# Patient Record
Sex: Female | Born: 1940
Health system: Southern US, Community
[De-identification: ages and names within clinical notes are randomized; demographics above are authoritative.]

## PROBLEM LIST (undated history)

## (undated) DIAGNOSIS — M797 Fibromyalgia: Secondary | ICD-10-CM

## (undated) DIAGNOSIS — G2581 Restless legs syndrome: Secondary | ICD-10-CM

## (undated) DIAGNOSIS — G4730 Sleep apnea, unspecified: Secondary | ICD-10-CM

## (undated) DIAGNOSIS — M1990 Unspecified osteoarthritis, unspecified site: Secondary | ICD-10-CM

## (undated) DIAGNOSIS — E785 Hyperlipidemia, unspecified: Secondary | ICD-10-CM

## (undated) DIAGNOSIS — I10 Essential (primary) hypertension: Secondary | ICD-10-CM

## (undated) DIAGNOSIS — E039 Hypothyroidism, unspecified: Secondary | ICD-10-CM

## (undated) DIAGNOSIS — K52831 Collagenous colitis: Secondary | ICD-10-CM

## (undated) HISTORY — PX: FOOT SURGERY: SHX648

## (undated) HISTORY — PX: APPENDECTOMY: SHX54

## (undated) HISTORY — DX: Collagenous colitis: K52.831

## (undated) HISTORY — PX: HYSTEROSCOPY: SHX211

## (undated) HISTORY — DX: Unspecified osteoarthritis, unspecified site: M19.90

## (undated) HISTORY — PX: BREAST BIOPSY: SHX20

## (undated) HISTORY — DX: Essential (primary) hypertension: I10

## (undated) HISTORY — DX: Fibromyalgia: M79.7

## (undated) HISTORY — DX: Hypothyroidism, unspecified: E03.9

## (undated) HISTORY — PX: DILATION AND CURETTAGE OF UTERUS: SHX78

## (undated) HISTORY — DX: Restless legs syndrome: G25.81

## (undated) HISTORY — DX: Hyperlipidemia, unspecified: E78.5

## (undated) HISTORY — DX: Sleep apnea, unspecified: G47.30

## (undated) HISTORY — PX: CATARACT EXTRACTION: SUR2

## (undated) HISTORY — PX: TONSILLECTOMY: SUR1361

---

## 1998-01-05 ENCOUNTER — Ambulatory Visit (HOSPITAL_COMMUNITY): Admission: RE | Admit: 1998-01-05 | Discharge: 1998-01-05 | Payer: Self-pay | Admitting: Obstetrics & Gynecology

## 1998-02-05 ENCOUNTER — Other Ambulatory Visit: Admission: RE | Admit: 1998-02-05 | Discharge: 1998-02-05 | Payer: Self-pay | Admitting: Obstetrics & Gynecology

## 1998-12-10 ENCOUNTER — Emergency Department (HOSPITAL_COMMUNITY): Admission: EM | Admit: 1998-12-10 | Discharge: 1998-12-10 | Payer: Self-pay | Admitting: Emergency Medicine

## 1998-12-10 ENCOUNTER — Encounter: Payer: Self-pay | Admitting: Emergency Medicine

## 1999-01-05 ENCOUNTER — Ambulatory Visit (HOSPITAL_COMMUNITY): Admission: RE | Admit: 1999-01-05 | Discharge: 1999-01-05 | Payer: Self-pay | Admitting: *Deleted

## 1999-02-04 ENCOUNTER — Ambulatory Visit (HOSPITAL_COMMUNITY): Admission: RE | Admit: 1999-02-04 | Discharge: 1999-02-04 | Payer: Self-pay | Admitting: Obstetrics and Gynecology

## 1999-03-04 ENCOUNTER — Other Ambulatory Visit: Admission: RE | Admit: 1999-03-04 | Discharge: 1999-03-04 | Payer: Self-pay | Admitting: Obstetrics and Gynecology

## 2000-02-07 ENCOUNTER — Encounter: Payer: Self-pay | Admitting: Obstetrics and Gynecology

## 2000-02-07 ENCOUNTER — Ambulatory Visit (HOSPITAL_COMMUNITY): Admission: RE | Admit: 2000-02-07 | Discharge: 2000-02-07 | Payer: Self-pay | Admitting: Obstetrics and Gynecology

## 2000-03-14 ENCOUNTER — Other Ambulatory Visit: Admission: RE | Admit: 2000-03-14 | Discharge: 2000-03-14 | Payer: Self-pay | Admitting: Obstetrics and Gynecology

## 2000-09-21 ENCOUNTER — Ambulatory Visit (HOSPITAL_COMMUNITY): Admission: RE | Admit: 2000-09-21 | Discharge: 2000-09-21 | Payer: Self-pay | Admitting: Obstetrics and Gynecology

## 2000-09-21 ENCOUNTER — Encounter (INDEPENDENT_AMBULATORY_CARE_PROVIDER_SITE_OTHER): Payer: Self-pay

## 2000-10-26 ENCOUNTER — Encounter: Payer: Self-pay | Admitting: Obstetrics and Gynecology

## 2000-10-26 ENCOUNTER — Ambulatory Visit (HOSPITAL_COMMUNITY): Admission: RE | Admit: 2000-10-26 | Discharge: 2000-10-26 | Payer: Self-pay | Admitting: Obstetrics and Gynecology

## 2001-01-10 ENCOUNTER — Encounter: Payer: Self-pay | Admitting: Obstetrics and Gynecology

## 2001-01-10 ENCOUNTER — Encounter: Admission: RE | Admit: 2001-01-10 | Discharge: 2001-01-10 | Payer: Self-pay | Admitting: Obstetrics and Gynecology

## 2001-04-03 ENCOUNTER — Other Ambulatory Visit: Admission: RE | Admit: 2001-04-03 | Discharge: 2001-04-03 | Payer: Self-pay | Admitting: Obstetrics and Gynecology

## 2002-04-17 ENCOUNTER — Other Ambulatory Visit: Admission: RE | Admit: 2002-04-17 | Discharge: 2002-04-17 | Payer: Self-pay | Admitting: Obstetrics and Gynecology

## 2002-07-16 ENCOUNTER — Ambulatory Visit (HOSPITAL_COMMUNITY): Admission: RE | Admit: 2002-07-16 | Discharge: 2002-07-16 | Payer: Self-pay | Admitting: *Deleted

## 2002-08-18 ENCOUNTER — Encounter: Payer: Self-pay | Admitting: Obstetrics and Gynecology

## 2002-08-18 ENCOUNTER — Ambulatory Visit (HOSPITAL_COMMUNITY): Admission: RE | Admit: 2002-08-18 | Discharge: 2002-08-18 | Payer: Self-pay | Admitting: Obstetrics and Gynecology

## 2002-12-18 ENCOUNTER — Emergency Department (HOSPITAL_COMMUNITY): Admission: EM | Admit: 2002-12-18 | Discharge: 2002-12-18 | Payer: Self-pay | Admitting: Emergency Medicine

## 2003-03-13 ENCOUNTER — Encounter: Payer: Self-pay | Admitting: Obstetrics and Gynecology

## 2003-03-13 ENCOUNTER — Encounter: Admission: RE | Admit: 2003-03-13 | Discharge: 2003-03-13 | Payer: Self-pay | Admitting: Obstetrics and Gynecology

## 2004-07-26 ENCOUNTER — Other Ambulatory Visit: Admission: RE | Admit: 2004-07-26 | Discharge: 2004-07-26 | Payer: Self-pay | Admitting: Obstetrics and Gynecology

## 2005-03-23 ENCOUNTER — Encounter: Admission: RE | Admit: 2005-03-23 | Discharge: 2005-03-23 | Payer: Self-pay | Admitting: Obstetrics and Gynecology

## 2005-08-07 HISTORY — PX: HAMMER TOE SURGERY: SHX385

## 2005-08-21 ENCOUNTER — Observation Stay (HOSPITAL_COMMUNITY): Admission: EM | Admit: 2005-08-21 | Discharge: 2005-08-22 | Payer: Self-pay | Admitting: Emergency Medicine

## 2005-08-21 ENCOUNTER — Ambulatory Visit: Payer: Self-pay | Admitting: Cardiology

## 2005-08-23 ENCOUNTER — Ambulatory Visit: Payer: Self-pay

## 2005-08-23 ENCOUNTER — Encounter: Payer: Self-pay | Admitting: Cardiovascular Disease

## 2005-09-26 ENCOUNTER — Other Ambulatory Visit: Admission: RE | Admit: 2005-09-26 | Discharge: 2005-09-26 | Payer: Self-pay | Admitting: Obstetrics and Gynecology

## 2005-11-04 ENCOUNTER — Emergency Department (HOSPITAL_COMMUNITY): Admission: AD | Admit: 2005-11-04 | Discharge: 2005-11-04 | Payer: Self-pay | Admitting: Family Medicine

## 2005-12-06 ENCOUNTER — Encounter: Payer: Self-pay | Admitting: Obstetrics and Gynecology

## 2006-09-12 ENCOUNTER — Encounter: Admission: RE | Admit: 2006-09-12 | Discharge: 2006-09-12 | Payer: Self-pay | Admitting: Obstetrics and Gynecology

## 2006-12-26 ENCOUNTER — Ambulatory Visit (HOSPITAL_COMMUNITY): Admission: RE | Admit: 2006-12-26 | Discharge: 2006-12-26 | Payer: Self-pay | Admitting: *Deleted

## 2007-09-12 ENCOUNTER — Encounter: Admission: RE | Admit: 2007-09-12 | Discharge: 2007-09-12 | Payer: Self-pay | Admitting: Internal Medicine

## 2007-11-06 ENCOUNTER — Encounter: Admission: RE | Admit: 2007-11-06 | Discharge: 2007-11-06 | Payer: Self-pay | Admitting: *Deleted

## 2008-02-03 ENCOUNTER — Encounter: Admission: RE | Admit: 2008-02-03 | Discharge: 2008-04-21 | Payer: Self-pay | Admitting: Internal Medicine

## 2008-03-05 ENCOUNTER — Ambulatory Visit (HOSPITAL_COMMUNITY): Admission: RE | Admit: 2008-03-05 | Discharge: 2008-03-05 | Payer: Self-pay | Admitting: *Deleted

## 2008-03-05 ENCOUNTER — Encounter (INDEPENDENT_AMBULATORY_CARE_PROVIDER_SITE_OTHER): Payer: Self-pay | Admitting: *Deleted

## 2009-01-03 ENCOUNTER — Emergency Department (HOSPITAL_COMMUNITY): Admission: EM | Admit: 2009-01-03 | Discharge: 2009-01-03 | Payer: Self-pay | Admitting: Family Medicine

## 2009-09-23 ENCOUNTER — Encounter: Admission: RE | Admit: 2009-09-23 | Discharge: 2009-09-23 | Payer: Self-pay | Admitting: Unknown Physician Specialty

## 2010-08-07 HISTORY — PX: BACK SURGERY: SHX140

## 2010-09-14 ENCOUNTER — Ambulatory Visit (HOSPITAL_COMMUNITY)
Admission: RE | Admit: 2010-09-14 | Discharge: 2010-09-14 | Disposition: A | Discharge status: Home / Self Care | Payer: MEDICARE | Source: Ambulatory Visit | Attending: Neurological Surgery | Admitting: Neurological Surgery

## 2010-09-14 ENCOUNTER — Encounter (HOSPITAL_COMMUNITY)
Admission: RE | Admit: 2010-09-14 | Discharge: 2010-09-14 | Disposition: A | Discharge status: Home / Self Care | Payer: MEDICARE | Source: Ambulatory Visit | Attending: Neurological Surgery | Admitting: Neurological Surgery

## 2010-09-14 ENCOUNTER — Other Ambulatory Visit (HOSPITAL_COMMUNITY): Payer: Self-pay | Admitting: Neurological Surgery

## 2010-09-14 DIAGNOSIS — G4730 Sleep apnea, unspecified: Secondary | ICD-10-CM | POA: Insufficient documentation

## 2010-09-14 DIAGNOSIS — Z01818 Encounter for other preprocedural examination: Secondary | ICD-10-CM | POA: Insufficient documentation

## 2010-09-14 DIAGNOSIS — M48061 Spinal stenosis, lumbar region without neurogenic claudication: Secondary | ICD-10-CM | POA: Insufficient documentation

## 2010-09-14 DIAGNOSIS — I10 Essential (primary) hypertension: Secondary | ICD-10-CM | POA: Insufficient documentation

## 2010-09-14 LAB — CBC
HCT: 39.5 % (ref 36.0–46.0)
MCHC: 33.4 g/dL (ref 30.0–36.0)
MCV: 93.2 fL (ref 78.0–100.0)
Platelets: 296 10*3/uL (ref 150–400)
RDW: 12.2 % (ref 11.5–15.5)
WBC: 7.2 10*3/uL (ref 4.0–10.5)

## 2010-09-14 LAB — BASIC METABOLIC PANEL
BUN: 12 mg/dL (ref 6–23)
CO2: 27 mEq/L (ref 19–32)
Creatinine, Ser: 0.84 mg/dL (ref 0.4–1.2)
GFR calc Af Amer: >60 mL/min (ref >60)
Glucose, Bld: 103 mg/dL — ABNORMAL HIGH (ref 70–99)
Potassium: 4 mEq/L (ref 3.5–5.1)

## 2010-09-14 LAB — SURGICAL PCR SCREEN
MRSA, PCR: NEGATIVE
Staphylococcus aureus: NEGATIVE

## 2010-09-19 ENCOUNTER — Inpatient Hospital Stay (HOSPITAL_COMMUNITY)
Admission: RE | Admit: 2010-09-19 | Discharge: 2010-09-23 | DRG: 460 | Disposition: A | Discharge status: Home Health | Payer: MEDICARE | Source: Ambulatory Visit | Attending: Neurological Surgery | Admitting: Neurological Surgery

## 2010-09-19 ENCOUNTER — Inpatient Hospital Stay (HOSPITAL_COMMUNITY): Payer: MEDICARE

## 2010-09-19 DIAGNOSIS — M48061 Spinal stenosis, lumbar region without neurogenic claudication: Principal | ICD-10-CM | POA: Diagnosis present

## 2010-09-19 DIAGNOSIS — G4733 Obstructive sleep apnea (adult) (pediatric): Secondary | ICD-10-CM | POA: Diagnosis present

## 2010-09-19 DIAGNOSIS — R339 Retention of urine, unspecified: Secondary | ICD-10-CM | POA: Diagnosis present

## 2010-09-19 DIAGNOSIS — M4310 Spondylolisthesis, site unspecified: Secondary | ICD-10-CM | POA: Diagnosis present

## 2010-09-19 DIAGNOSIS — I10 Essential (primary) hypertension: Secondary | ICD-10-CM | POA: Diagnosis present

## 2010-09-19 DIAGNOSIS — Z7982 Long term (current) use of aspirin: Secondary | ICD-10-CM

## 2010-10-18 NOTE — Op Note (Signed)
Chelsea Taylor, Chelsea Taylor                ACCOUNT NO.:  [id number]  MEDICAL RECORD NO.:  [id number]           PATIENT TYPE:  I  LOCATION:  3114                         FACILITY:  [facility]  PHYSICIAN:  Stefani Dama, M.D.  DATE OF BIRTH:  Jan 03, 1941  DATE OF PROCEDURE: DATE OF DISCHARGE:                              OPERATIVE REPORT   PREOPERATIVE DIAGNOSIS:  Lumbar spondylolisthesis L4-L5 with severe stenosis.  POSTOPERATIVE DIAGNOSIS:  Lumbar spondylolisthesis L4-L5 with severe stenosis.  PROCEDURE:  Lumbar decompression L4-L5 with laminectomy L4, decompression of L4 and L5 nerve roots beyond that which is required for simple posterior lumbar interbody arthrodesis, posterior lumbar interbody arthrodesis with PEEK spacers, local autograft and allograft, posterolateral arthrodesis with local autograft and allograft, pedicle screw fixation L4 and L5.  SURGEON:  Stefani Dama, MD  FIRST ASSISTANT:  Hewitt Shorts, MD  ANESTHESIA:  General endotracheal.  INDICATIONS:  Chelsea Taylor is a 70 year old individual who has had significant back and bilateral lower extremity pain from severe spondylitic stenosis.  She had spondylolisthesis at the L4-L5 level and has a high- grade block at that level.  The patient was advised regarding the need for surgical decompression and stabilization and is now taken to the operating room for this procedure.  PROCEDURE:  The patient was brought to the operating room, supine on a stretcher.  After smooth induction of general endotracheal anesthesia, she was turned prone.  The back was prepped with alcohol and DuraPrep and draped in a sterile fashion.  Midline incision was created and carried down to the lumbodorsal fascia.  The spinous processes of L4 and L5 were identified positively with radiographs and then laminotomy was created on the left side first decompressing inferior aspect of the L4 vertebra out to and including a complete facetectomy.  The  yellow ligament was identified and it was noted to be severely thickened.  It was carefully lifted.  The lateral aspect of the medial wall of the facet was then taken out with a high-speed drill.  Entirety of the superior articular process was then identified and this was also decompressed to allow for egress of the L5 nerve root.  The disk space was then explored and it was noted to be significantly bulging posteriorly.  Epidural veins in this area were cauterized and divided so as to allow retraction of the common dural tube and the L5 nerve root. The L4 nerve root was identified superiorly to egress out under the pedicle of L4.  This area was decompressed also.  A similar laminotomy and decompression was then performed on the opposite side.  This was done with the help of Dr. Newell Coral who provided retraction and exposure while I worked drilling and removing bits of bone and tissue.  The disk space was then entered and a complete diskectomy was performed at L4 and L5 using a series of rongeurs and osteotomes to decorticate the endplates of L4 and L5.  The interspace was then distracted with the 10- mm spacer on one side while we worked on the opposite side to complete the diskectomy and decorticate the endplates.  In the end,  it was felt that 11-mm PEEK spacer would fit the best and this was filled with autograft and allograft with PureGen on Vitoss bone sponge.  The interspace was filled with this material.  The PEEK spacers were then placed into each side and countersunk appropriately.  Then with fluoroscopic imaging, we were able to place pedicle screws in L4 and L5 sounding each hole individually, tapping it, and then checking for evidence of cutout.  A 6.5- x 45-mm screws were placed in all four spaces at L4 and L5.  Two 40-mm precontoured rods were then used to connect the screw heads in a neutral construct.  Final radiographs were obtained.  The lateral gutters which had been  decorticated earlier were then packed with the remaining autograft and the Vitoss bone sponge soaked with PureGen.  Hemostasis in the soft tissues was obtained before final closure.  We checked for the patency of the L4 and the L5 nerve roots.  Common dural tube was also noted to be patent.  No spinal fluid leaks occurred and the lumbodorsal fascia was closed with #1 Vicryl in an interrupted fashion, 2-0 Vicryl was used in the subcutaneous tissues, 3-0 Vicryl subcuticularly, and Dermabond was placed on the skin.  Blood loss for the procedure was estimated 200 mL.     Stefani Dama, M.D.     Merla Riches  D:  09/19/2010  T:  09/20/2010  Job:  [id number]  Electronically Signed by Barnett Abu M.D. on 10/18/2010 09:23:13 AM

## 2010-10-18 NOTE — Discharge Summary (Signed)
NAMESHELTON, SOLER                ACCOUNT NO.:  [id number]  MEDICAL RECORD NO.:  [id number]           PATIENT TYPE:  I  LOCATION:  3019                         FACILITY:  [facility]  PHYSICIAN:  Stefani Dama, M.D.  DATE OF BIRTH:  November 03, 1940  DATE OF ADMISSION:  09/19/2010 DATE OF DISCHARGE:  09/23/2010                              DISCHARGE SUMMARY   ADMITTING DIAGNOSIS:  Lumbar spondylolisthesis, degenerative type, L4-5 with severe spinal stenosis, multifactorial.  DISCHARGE DIAGNOSIS:  Lumbar spondylolisthesis, degenerative type, L4-5 with severe spinal stenosis, multifactorial.  SECONDARY DIAGNOSES: 1. Hypertension. 2. History of colitis.  OPERATIONS AND PROCEDURES:  Posterior spinal decompression and fusion L4- 5 with interbody spacers and posterolateral arthrodesis, pedicle screw fixation.  BRIEF HISTORY AND HOSPITAL COURSE:  The patient is a 70 year old female who has had significant low back pain, bilateral lower extremity pain with severe spondylitic multifactorial spinal stenosis, and degenerative spondylolisthesis at L4-5 high-grade block at this level.  She failed conservative care to include physical therapy, home exercise program, epidural steroid injections, medications at time, and with continued symptomatology, elects to proceed with decompression and fusion and surgical intervention.  She tolerated the surgery well on September 19, 2010.  Postoperatively, she was placed in the Neurosurgical ICU, placed on PCA Dilaudid pain pump.  First day postoperatively, she was doing well, eating well.  Foley catheter was discontinued.  She did have some urinary retention that responded well to one dose of Flomax.  She was weaned off PCA pump.  Her IV was hep-locked, placed on Percocet and Robaxin or Flexeril for pain control and she was also given IV Toradol. She was transferred to 3000.  Discharge planning was arranged.  Started with physical therapy and occupational  therapy, instructed on back precautions, placed in an Aspen QuickDraw brace for support to her lumbar spine postoperatively when she is up and about.  She did have to undergo in-and-out cath x1 prior to her dose of Flomax.  She then was voiding well, eating well, ambulating safely, and ready for discharge home on September 23, 2010.  She remained neurovascularly intact throughout her hospital stay.  She was thought to have a shower and do other ADLs with occupational therapy prior to discharge home.  She was running low-grade fever, started on incentive spirometry which she is to continue when she goes home, and encouraged on fluid intake and mobilization.  DISCHARGE INSTRUCTIONS:  Discharged home on September 23, 2010.  Given prescription for Percocet 5/325 one p.o. q.4 h p.r.n. pain, #60 tablets, no refills.  Given prescription for Flexeril 10 mg 1 p.o. q.8-12 h p.r.n. muscle spasm, #60, no refills.  She is to continue on her home medications of: 1. Losartan 100 mg p.o. daily. 2. Fish oil 1200 mg over-the-counter p.o. daily. 3. Bisoprolol 5 mg p.o. daily. 4. Flexeril 10 mg one p.o. q.8-12 h p.r.n. muscle spasm. 5. Enteric-coated aspirin 81 mg p.o. daily. 6. Potassium gluconate p.o. at bedtime. 7. Sulfasalazine 500 mg 2 tablets b.i.d. 8. Synthroid 137 mcg p.o. daily. 9. CoQ10 of 100 mg p.o. daily. 10.Magnesium, red yeast rice, bioactive calcium supplement, vitamin C  and vitamin D over-the-counter supplements. She may restart as she takes at home.  Follow up with Dr. Danielle Dess in 3-4 weeks in our office, call for an appointment.  Continue back precautions.  All questions were encouraged and answered and addressed.  DISCHARGE CONDITION:  Stable and improved.     Aura Fey Bobbe Medico.   ______________________________ Stefani Dama, M.D.    SCI/MEDQ  D:  09/23/2010  T:  09/24/2010  Job:  [id number]  Electronically Signed by Orlin Hilding P.A. on 09/28/2010 02:51:14  PM Electronically Signed by Barnett Abu M.D. on 10/18/2010 09:23:03 AM

## 2010-11-15 LAB — DIFFERENTIAL
Basophils Relative: 1 % (ref 0–1)
Eosinophils Absolute: 0.1 10*3/uL (ref 0.0–0.7)
Eosinophils Relative: 1 % (ref 0–5)
Lymphs Abs: 1.3 10*3/uL (ref 0.7–4.0)
Monocytes Relative: 7 % (ref 3–12)
Neutro Abs: 7.9 10*3/uL — ABNORMAL HIGH (ref 1.7–7.7)
Neutrophils Relative %: 78 % — ABNORMAL HIGH (ref 43–77)

## 2010-11-15 LAB — POCT I-STAT, CHEM 8
Calcium, Ion: 1.14 mmol/L (ref 1.12–1.32)
Hemoglobin: 13.3 g/dL (ref 12.0–15.0)
TCO2: 23 mmol/L (ref 0–100)

## 2010-11-15 LAB — CBC
Hemoglobin: 12.4 g/dL (ref 12.0–15.0)
RBC: 3.97 MIL/uL (ref 3.87–5.11)
WBC: 10 10*3/uL (ref 4.0–10.5)

## 2010-12-20 NOTE — Assessment & Plan Note (Signed)
[facility]                                 ON-CALL NOTE   CHRISHELLE, ZITO                         MRN:          [id number]  DATE:01/03/2009                            DOB:          04/17/1941    Patient: Chelsea Taylor   Phone number [phone].   PHYSICIAN:  Dr. Sabino Gasser.   [name] relates a history of colitis and states that she is  maintained on Azulfidine.  She was placed on indomethacin several weeks  ago and developed diarrhea and indomethacin was discontinued.  Since  then she has had persistent diarrhea.  She apparently spoke to Dr. Virginia Rochester  who advised her to remain on Azulfidine presuming this was a self-  limited diarrhea, however, her symptoms have persisted.  She notes no  fevers, chills, nausea, vomiting, abdominal pain, or blood in her bowel  movements.  Her appetite is good and she is eating well; however, she is  concerned that the diarrhea has persisted.  She has Entocort at home and  has taken it in the past for colitis flare and wondered if she should  start this medication.  I advised her to be seen in an urgent care  center today for further evaluation with blood work and stool cultures.  If no cause is uncovered, I think it would be reasonable to start  Entocort 9 mg daily in addition to Azulfidine.  She is advised to  contact Dr. Wende Neighbors office Tuesday morning for furhter advice and an  appointment.     Venita Lick. Russella Dar, MD, [facility]  Electronically Signed    MTS/MedQ  DD: 01/03/2009  DT: 01/03/2009  Job #: [id number]   cc:   Georgiana Spinner, M.D.

## 2010-12-20 NOTE — Op Note (Signed)
Chelsea Taylor, Chelsea Taylor                ACCOUNT NO.:  [id number]   MEDICAL RECORD NO.:  [id number]          PATIENT TYPE:  AMB   LOCATION:  ENDO                         FACILITY:  [facility]   PHYSICIAN:  Georgiana Spinner, M.D.    DATE OF BIRTH:  06-12-1941   DATE OF PROCEDURE:  03/05/2008  DATE OF DISCHARGE:                               OPERATIVE REPORT   PROCEDURE:  Colonoscopy.   INDICATIONS:  Diarrhea.   ANESTHESIA:  1. Fentanyl 100 mcg.  2. Versed 7 mg.   PROCEDURE:  With the patient mildly sedated in the left lateral  decubitus position, the Pentax videoscopic colonoscope was inserted in  the rectum and passed under direct vision.  With pressure applied and  the patient rolled to her back, we finally were able to reach the cecum,  identified by the base of cecum and ileocecal valve, both of which were  photographed.  From this point, the colonoscope was slowly withdrawn,  taking circumferential views of the colonic mucosa, stopping along the  way to take random biopsies of normal-appearing mucosa, suctioning milky-  like material from throughout the colon until we reached the rectum,  which appeared normal on direct and showed hemorrhoids on retroflexed  view.  The endoscope was straightened and withdrawn.  The patient's  vital signs and pulse oximeter remained stable.  The patient tolerated  procedure well, without apparent complication.   FINDINGS:  Tortuous colon, and internal hemorrhoids noted, but otherwise  unremarkable, with a very long colon with the patient's inability to  hold making this somewhat difficult to do.           ______________________________  Georgiana Spinner, M.D.     GMO/MEDQ  D:  03/05/2008  T:  03/05/2008  Job:  [id number]

## 2010-12-20 NOTE — Op Note (Signed)
NAMEMARIALY, URBANCZYK                ACCOUNT NO.:  [id number]   MEDICAL RECORD NO.:  [id number]          PATIENT TYPE:  AMB   LOCATION:  ENDO                         FACILITY:  [facility]   PHYSICIAN:  Georgiana Spinner, M.D.    DATE OF BIRTH:  1941/03/17   DATE OF PROCEDURE:  12/26/2006  DATE OF DISCHARGE:                               OPERATIVE REPORT   PROCEDURE:  Colonoscopy.   INDICATIONS:  Colon polyps.   ANESTHESIA:  1. Fentanyl 75 mcg,.  2. Versed 7 mg.   PROCEDURE:  With the patient mildly sedated in the left lateral  decubitus position, the Pentax videoscopic colonoscope was inserted into  the rectum and. after pulling back and withdrawn and reinserting, and  with pressure applied and the patient rolled to her back, we were  subsequently able to reach the cecum, as identified by ileocecal valve  and appendiceal orifice, both of which were photographed.  From this  point, the colonoscope was slowly withdrawn, taking circumferential  views of the colonic mucosa.  The prep was somewhat suboptimal in that  there were multiple areas of copious amounts of liquid brownish material  that had particulate matter and that had to be suctioned.  The patient  took a gallon of Gatorade to dilute her prep rather than 2 liters, but  we withdrew all the way to the rectum, which appeared normal on direct  and showed hemorrhoids on retroflexed view.  The endoscope was  straightened and withdrawn.  The patient's vital signs and pulse  oximeter remained stable.  The patient tolerated the procedure well,  without apparent complications.   FINDINGS:  Internal hemorrhoids.  Otherwise, an unremarkable exam.   PLAN:  Have the patient follow up with me in 5 years or as needed.           ______________________________  Georgiana Spinner, M.D.     GMO/MEDQ  D:  12/26/2006  T:  12/26/2006  Job:  [id number]

## 2010-12-23 NOTE — Op Note (Signed)
[facility]  Patient:    Chelsea Taylor, Chelsea Taylor                         MRN: [id number] Proc. Date: 09/21/00 Attending:  Erie Noe P. Pennie Rushing, M.D.                           Operative Report  PREOPERATIVE DIAGNOSIS:       Postmenopausal bleeding.  POSTOPERATIVE DIAGNOSIS:      Endometrial polyp and endometrial adhesions.  OPERATION:                    Operative hysteroscopy with hysteroscopic polypectomy, removal of adhesion and curettage.  SURGEON:                      Vanessa P. Pennie Rushing, M.D.  ANESTHESIA:                   General LMA.  ESTIMATED BLOOD LOSS:         Less than 50 cc.  COMPLICATIONS:                None.  FINDINGS:                     The uterus sounded to 7 cm.  At hysteroscopic examination, the endometrium was quite atrophic.  There was a 2 mm polyp at the fundus near the right ostium.  There was likewise an adhesion between the anterior and posterior endometrium that was just caudad to that endometrial polyp.  No clear uterine fibroid could be detected within the endometrial cavity.  DESCRIPTION OF PROCEDURE:     The patient was taken to the operating room after appropriate identification and placed on the operating table.  After the attainment of adequate general anesthesia, she was placed in the lithotomy position.  The perineum and vagina were prepped with multiple layers of Betadine.  The bladder was emptied with a red Robinson catheter.  The perineum was draped as a sterile field.  A Graves speculum was placed in the vagina and a single tooth tenaculum placed on the cervix.  The cervix was then dilated to accommodate the diagnostic hysteroscope and the hysteroscope was inserted with the above noted findings made and documented.  The operating hysteroscope was then replaced and the aforementioned endometrial polyp excised.  This was removed from the operative field and curettage undertaken.  That tissue was sent as a separate  specimen as endometrial curettings.  On replacement of the hysteroscope, it was noted that the polyps had been removed and the intrauterine adhesion had likewise been removed.  Hemostasis was noted to be adequate.  All instruments were then removed from the uterus and the fluid deficit noted to be 190 cc.  All instruments were removed from the vagina and the patient awakened from general anesthesia and taken to the recovery room in satisfactory condition, having tolerated the procedure well with sponge and instrument counts correct. DD:  09/21/00 TD:  09/21/00 Job: [id number]

## 2010-12-23 NOTE — Discharge Summary (Signed)
NAMEDAKISHA, SCHOOF                ACCOUNT NO.:  [id number]   MEDICAL RECORD NO.:  [id number]          PATIENT TYPE:  INP   LOCATION:  6524                         FACILITY:  [facility]   PHYSICIAN:  Charlton Haws, M.D.     DATE OF BIRTH:  1941/02/27   DATE OF ADMISSION:  08/21/2005  DATE OF DISCHARGE:  08/22/2005                                 DISCHARGE SUMMARY   PRIMARY CARDIOLOGIST:  Dayton Bing, M.D. [facility] (new).   PRINCIPAL DIAGNOSES:  1.  Angina pectoris (new onset).      1.  Normal serial cardiac markers.      2.  scheduled for same-day outpatient pharmacologic stress test.      3.  Negative routine treadmill test in 2004.  2.  Hypertension.   HISTORY OF PRESENT ILLNESS:  Ms. Elrod is a 70 year old female with no  prior cardiac history who has cardiac risk factors notable for hypertension,  family history, and age who presented to the emergency room with new onset  bilateral jaw and chest pain, worrisome for unstable angina pectoris.   HOSPITAL COURSE:  The patient was seen and evaluated through the emergency  room, with recommendations to proceed with cardiac catheterization.  However, she declined but agreed to proceed with a stress test if she ruled  out for myocardial infarction.   Serial cardiac markers were all within normal limits.  Additionally, a D-  dimer was also negative.   Admission chest x-ray notable for prominent left hilum/pulmonary artery.  Dr. Dietrich Pates recommended that this be further evaluated as an outpatient.   The patient also reported dysphagia on admission and will also need further  workup for this as well.   The patient was cleared for discharge the following morning, with  arrangements to proceed with same-day pharmacologic stress testing in our  office.  Of note, the patient is unable to exercise, given recent surgery on  her left foot.   The patient was cleared for discharge on all previous home medications.   DISCHARGE MEDICATIONS:  1.   Enteric-coated aspirin 81 mg daily.  2.  Toprol-XL 25 mg daily.  3.  Synthroid 0.112 daily.  4.  Micardis 40 daily.  5.  Mirapex 0.5 q.h.s.  6.  Fosamax weekly.   DISCHARGE INSTRUCTIONS:  Proceed with adenosine stress test today, August 22, 2005 at 11:45 a.m. at [facility].   FOLLOW UP:  1.  Followup with Dr.  Bing as needed.  2.  Schedule followup with Dr. Dimas Alexandria.   DISCHARGE DURATION TIME:  Less than 30 minutes.      Gene Serpe, P.A. [facility]    ______________________________  Charlton Haws, M.D.    GS/MEDQ  D:  08/22/2005  T:  08/22/2005  Job:  [id number]   cc:   Janae Bridgeman. Eloise Harman., M.D.  Fax: [phone]

## 2010-12-23 NOTE — H&P (Signed)
[facility]  Patient:    Chelsea Taylor, Chelsea Taylor                     MRN: [id number] Attending:  Maris Berger. Pennie Rushing, M.D. Dictator:   Marquis Lunch. Lowell Guitar, P.A.-C.                         History and Physical  DATE OF BIRTH:                1941/07/20  HISTORY OF PRESENT ILLNESS:   Ms. Bradshaw is a 70 year old married menopausal white female, para 1-0-0-1, with a five day history of vaginal spotting and cramping.  The patient is on Prempro 0.625/2.5 for hormone replacement therapy and has not had any bleeding until this current episode.  In 1990, the patient underwent a hysteroscopy D&C for irregular bleeding and was found to have focal hyperplasia.  Follow-up endometrial biopsies have been precluded by the patients stenotic cervix.  She presents today for hysteroscopy D&C to evaluate postmenopausal bleeding.  PAST MEDICAL HISTORY AND OBSTETRICAL HISTORY:     Gravida 1, para 1-0-0-1; the patient had a female infant in 29.  GYNECOLOGIC HISTORY:          Menarche 70 years old.  She has a history of uterine fibroids.  The patient had a normal mammogram July 2001 and a normal Pap smear August 2001.  PAST SURGICAL HISTORY:        S/P, hysteroscopy and D&C (focal hyperplasia); and left breast needle biopsy (fibroadenoma), tonsillectomy, and appendectomy. The patient does not want any blood products.  MEDICAL HISTORY:              Migraines, hypothyroidism, and urge incontinence.  FAMILY HISTORY:               Positive for juvenile onset diabetes (daughter), colon cancer, cardiovascular disease, and hypertension.  SOCIAL HISTORY:               The patient is married, and she is retired.  CURRENT MEDICATIONS:          1. Synthroid 150 mcg daily.                               2. Prempro 0.625/2.5.                               3. Flexeril 10 mg.  DRUG SENSITIVITIES:           PENICILLIN which causes a rash.  CODEINE which causes dizziness.  REVIEW OF  SYSTEMS:            Negative except as mentioned in previous history of present illness.  PHYSICAL EXAMINATION:  GENERAL:                      The patient is a well-developed, well-nourished white female in no acute distress.  VITAL SIGNS:                  Blood pressure 124/80, weight 168, height 5 feet 7-1/2 inches tall.  ENT:                          Within normal limits.  Thyroid is not enlarged.  HEART:  Regular rate and rhythm.  No murmur.  LUNGS:                        Without wheezes, rales, or rhonchi.  BACK:                         Without CVA tenderness.  ABDOMEN:                      Bowel sounds are present.  It is soft, nontender.  There is no organomegaly.  EXTREMITIES:                  No clubbing, cyanosis, or edema.  NEUROLOGIC:                   Within normal limits.  PELVIC:                       EG/BUS is within normal limits.  Vagina is normal though mildly atrophic.  Cervix is nontender with a stenotic os.  There are no lesions.  Uterus is approximately 10 weeks size and nontender.  Adnexa without tenderness or masses.  Rectovaginal without tenderness or masses.  IMPRESSION:                   Postmenopausal bleeding.  DISPOSITION:                  Implications for patients procedure along with risks and benefits were discussed.  The patient has consented to undergo a hysteroscopy D&C at [facility], September 21, 2000, at 2:30 pm DD:  09/20/00 TD:  09/20/00 Job: [id number]

## 2010-12-23 NOTE — H&P (Signed)
NAMEJAYLN, BRANSCOM                ACCOUNT NO.:  [id number]   MEDICAL RECORD NO.:  [id number]          PATIENT TYPE:  INP   LOCATION:  1832                         FACILITY:  [facility]   PHYSICIAN:  [place] Bing, M.D. LHCDATE OF BIRTH:  October 26, 1940   DATE OF ADMISSION:  08/21/2005  DATE OF DISCHARGE:                                HISTORY & PHYSICAL   PRIMARY CARDIOLOGIST:  Dr. [place] Bing (new)   REASON FOR ADMISSION:  Ms. Stegner is a 70 year old female with no prior  cardiac history who now presents to the emergency room with new onset  bilateral jaw, chest, and right biceps pain.   Patient has cardiac risk factors notable for hypertension, age, and family  history of coronary artery disease.  She denies any recent development of  any exertional chest pain or dyspnea.   While laying in bed reading a book at about 12:30 this afternoon patient  developed sudden, severe (8/10) bilateral jaw pain which subsequently  radiated down into the chest and into the right shoulder/biceps.  There was  no associated diaphoresis/dyspnea, or nausea/vomiting.   Patient contacted EMS who treated her with four baby aspirin, but no  nitroglycerin and she presented to the emergency room with complete  resolution of her chest discomfort.  A 12-lead EKG on arrival shows normal  sinus rhythm with no acute changes.  Initial cardiac enzymes are negative.   Patient reports having had a negative routine treadmill in 2004, but denied  any associated chest pain for that study.   ALLERGIES:  PENICILLIN, CODEINE.   HOME MEDICATIONS:  1.  Synthroid 0.112 daily.  2.  Micardis 40 daily.  3.  Toprol XL 25 daily.  4.  Fosamax every week.  5.  Mirapex 0.5 q.h.s.  6.  Aspirin 81 daily.  7.  Red yeast rice.   PAST MEDICAL HISTORY:  1.  Hypertension.  2.  Hypothyroidism.  3.  Gastroesophageal reflux disease/negative esophagogastroduodenoscopy      2003.  4.  Recurrent urinary tract infections.  5.   Status post appendectomy.  6.  Patient also had recent left foot surgery in November 2006 for treatment      of hammer toe (x5).   SOCIAL HISTORY:  Patient lives here in [place] with her husband.  She is  a retired Air[employer] pilot.  They have one grown daughter.  She has never smoked  tobacco and denies alcohol use.   FAMILY HISTORY:  Mother deceased age 42, history of CHF.  Father deceased  age 9 secondary to cancer, history of hypertension.  Brother age 55 status  post MI approximately 10 years ago.   REVIEW OF SYSTEMS:  As noted per HPI.  Denies any prior history of  myocardial infarction, congestive heart failure, or a stroke.  Denies any  current reflux symptoms, but does have difficulty with dysphagia.  Denies  any recent overt bleeding.  She experienced menopause in her 63s.  Remaining  systems negative.   PHYSICAL EXAMINATION:  VITAL SIGNS:  Blood pressure 151/75, temperature  98.4, pulse 83, respirations 18, saturations 98% on room air.  GENERAL:  70 year old female in no apparent distress.  HEENT:  Normocephalic, atraumatic.  NECK:  Preserved bilateral carotid pulses without bruits.  LUNGS:  Clear to auscultation in all fields.  HEART:  Regular rate and rhythm (S1, S2).  Positive S4, but no significant  murmurs.  ABDOMEN:  Soft, nontender with intact bowel sounds.  EXTREMITIES:  Preserved bilateral femoral pulses without bruits; intact  distal pulses with trace left lower extremity edema.  NEUROLOGIC:  No focal deficit.   Admission chest x-ray:  Prominent left hilum/pulmonary artery.  Electrocardiogram:  Normal sinus rhythm at 82 BPM with normal axis and no  acute changes.   LABORATORY DATA:  Hemoglobin 13, hematocrit 38, WBC 7.6, platelets 369.  Sodium 138, potassium 4.4, BUN 14, creatinine 0.8, glucose 99.  Cardiac  enzymes (POC):  MB 1.5, troponin I less than 0.05.   IMPRESSION:  1.  Unstable angina pectoris.  2.  Multiple cardiac risk factors.      1.   Hypertension.      2.  Family history of premature coronary artery disease.      3.  Age.  3.  Hypothyroidism.  4.  Abnormal chest x-ray.      1.  Prominent left hilum/pulmonary artery.  5.  Mild dysphagia.  6.  Status post recent left foot surgery.   PLAN:  Patient presents with new onset jaw pain which is worrisome for  unstable angina pectoris.  Despite the recommendation to proceed with  diagnostic coronary angiography patient is not inclined at this time to  proceed.  We will therefore keep patient for a 24-hour observation for rule  out of myocardial infarction.  If serial markers are negative then patient  will be cleared for discharge in the morning with plans to proceed with an  outpatient pharmacologic stress test tomorrow.  Regarding medications,  patient will continue on aspirin and we will switch  Toprol to Lopressor to 25 t.i.d.  We will defer anticoagulation for now and  will check a D-dimer level, TSH, and fasting lipid profile in the morning.  Of note, patient will need subsequent work-up for her dysphagia by her  primary care physician.  Additionally, she will also need to follow up on  the borderline abnormal chest x-ray result.      Gene Serpe, P.A. [facility]      [place] Bing, M.D. [facility]  Electronically Signed    GS/MEDQ  D:  08/21/2005  T:  08/21/2005  Job:  [id number]   cc:   Janae Bridgeman. Eloise Harman., M.D.  Fax: [phone]

## 2010-12-23 NOTE — Op Note (Signed)
   NAME:  Chelsea Taylor, Chelsea Taylor                          ACCOUNT NO.:  [id number]   MEDICAL RECORD NO.:  [id number]                   PATIENT TYPE:  AMB   LOCATION:  ENDO                                 FACILITY:  [facility]   PHYSICIAN:  Georgiana Spinner, M.D.                 DATE OF BIRTH:  1940-10-09   DATE OF PROCEDURE:  07/16/2002  DATE OF DISCHARGE:                                 OPERATIVE REPORT   PROCEDURE:  Upper endoscopy.   INDICATIONS:  Gastroesophageal reflux disease, abdominal pain.   ANESTHESIA:  Demerol 60, Versed 6 mg.   PROCEDURE:  With the patient mildly sedated in the left lateral decubitus  position, the Olympus video endoscope was inserted into the mouth and passed  under direct visualization through the esophagus which appeared normal into  the stomach. The fundus body and antrum, duodenal bulb and the second  portion of the duodenum appeared normal.   From this point the endoscope was slowly withdrawn, taking circumferential  views of the duodenal mucosa. The endoscope was then pulled back into the  stomach and placed in retroflexion to view the stomach from below. The  endoscope was then straightened and withdrawn, taking several more views of  the remaining gastric and esophageal mucosa. The patient's vital signs and  pulse oximetry remained stable. The patient tolerated the procedure well  without apparent complications.   FINDINGS:  Negative examination.   PLAN:  Proceed to colonoscopy.                                               Georgiana Spinner, M.D.    GMO/MEDQ  D:  07/16/2002  T:  07/16/2002  Job:  [id number]

## 2010-12-23 NOTE — Op Note (Signed)
   NAME:  Chelsea Taylor, Chelsea Taylor                          ACCOUNT NO.:  [id number]   MEDICAL RECORD NO.:  [id number]                   PATIENT TYPE:  AMB   LOCATION:  ENDO                                 [facility]ACILITY[facility]  [facility]   PHYSICIAN:  Georgiana Spinner, M.D.                 DATE OF BIRTH:  04-Sep-1940   DATE OF PROCEDURE:  07/16/2002  DATE OF DISCHARGE:                                 OPERATIVE REPORT   PROCEDURE:  Colonoscopy.   INDICATIONS:  Colon polyps previously noted.   ANESTHESIA:  Demerol 40 mg, Versed 4 mg additionally.   DESCRIPTION OF PROCEDURE:  With the patient mildly sedated in the left  lateral decubitus position, the Olympus videoscopic colonoscope was inserted  in the rectum and passed under direct vision to the cecum, identified by  ileocecal valve and appendiceal orifice, both of which were photographed.  From this point the colonoscope was slowly withdrawn, taking circumferential  views of the entire colonic mucosa, stopping only then in the rectum, which  appeared normal on direct and showed hemorrhoids on retroflexed view.  The  endoscope was straightened and withdrawn.  The patient's vital signs and  pulse oximetry remained stable.  The patient tolerated the procedure well  without apparent complications.   FINDINGS:  Internal hemorrhoids, otherwise unremarkable examination.   PLAN:  Repeat examination in five years.                                               Georgiana Spinner, M.D.    GMO/MEDQ  D:  07/16/2002  T:  07/16/2002  Job:  [id number]

## 2011-12-07 ENCOUNTER — Ambulatory Visit: Payer: Self-pay | Admitting: Obstetrics and Gynecology

## 2012-04-22 ENCOUNTER — Encounter: Payer: Self-pay | Admitting: Cardiology

## 2012-08-07 HISTORY — PX: HAMMER TOE SURGERY: SHX385

## 2013-02-28 ENCOUNTER — Encounter: Payer: Self-pay | Admitting: Neurology

## 2013-02-28 ENCOUNTER — Ambulatory Visit (INDEPENDENT_AMBULATORY_CARE_PROVIDER_SITE_OTHER): Payer: Medicare Other | Admitting: Neurology

## 2013-02-28 ENCOUNTER — Other Ambulatory Visit: Payer: Self-pay | Admitting: Neurology

## 2013-02-28 VITALS — BP 120/68 | HR 77 | Ht 66.25 in | Wt 172.0 lb

## 2013-02-28 DIAGNOSIS — G2581 Restless legs syndrome: Secondary | ICD-10-CM | POA: Insufficient documentation

## 2013-02-28 HISTORY — DX: Restless legs syndrome: G25.81

## 2013-02-28 NOTE — Progress Notes (Signed)
Reason for visit: Leg jerking  Chelsea Taylor is a 72 y.o. female  History of present illness:  Chelsea Taylor is a 72 year old right-handed white female with a history of lower extremity jerking of the last 2-3 years. The patient indicates that the episodes occur when she is inactive, usually beginning in mid to late afternoon, and also bothering her at nighttime when she tries to sleep. The jerking may occur in either leg independently or together. The patient has a sensation prior to this happening. The patient denies any weakness or numbness of the extremities, and she denies any jerking of the arms. The patient does not relate the leg jerking to any particular medication that was added or taken away at the time of onset. The patient has taken ropinirole and clonazepam, both of these medications have helped some. The patient believes that the effectiveness of the medications has waned over time. The patient denies any problems with balance or problems controlling the bowels or the bladder. The patient denies any neck discomfort, but she does occasionally have some left lower back or buttock discomfort. The patient denies any memory or confusion. The patient has not had any blackout episodes. The patient is sent to this office for an evaluation.  Past Medical History  Diagnosis Date  . Hypertension   . Fibromyalgia   . Restless legs syndrome (RLS) 02/28/2013  . Dyslipidemia   . Hypothyroid   . Collagenous colitis   . Degenerative arthritis     Knees  . Sleep apnea     Past Surgical History  Procedure Laterality Date  . Hammer toe surgery  2014  . Back surgery  2012  . Hammer toe surgery  2007  . Cataract extraction    . Tonsillectomy    . Appendectomy    . Dilation and curettage of uterus    . Hysteroscopy    . Breast biopsy Left     Fibroadenoma    Family History  Problem Relation Age of Onset  . Heart failure Mother   . Colon cancer Father   . Heart attack Brother   .  Diabetes Daughter     Social history:  reports that she has never smoked. She does not have any smokeless tobacco history on file. She reports that  drinks alcohol. She reports that she does not use illicit drugs.  Medications:  No current outpatient prescriptions on file prior to visit.   No current facility-administered medications on file prior to visit.    Allergies:  Allergies  Allergen Reactions  . Adhesive (Tape)   . Codeine   . Neosporin (Neomycin-Bacitracin Zn-Polymyx)   . Penicillins     ROS:  Out of a complete 14 system review of symptoms, the patient complains only of the following symptoms, and all other reviewed systems are negative.  Fatigue Snoring Joint pain, achy muscles Insomnia, decreased energy Restless legs  Blood pressure 120/68, pulse 77, height 5' 6.25" (1.683 m), weight 172 lb (78.019 kg).  Physical Exam  General: The patient is alert and cooperative at the time of the examination.  Head: Pupils are equal, round, and reactive to light. Discs are flat bilaterally.  Neck: The neck is supple, no carotid bruits are noted.  Respiratory: The respiratory examination is clear.  Cardiovascular: The cardiovascular examination reveals a regular rate and rhythm, no obvious murmurs or rubs are noted.  Skin: Extremities are without significant edema.  Neurologic Exam  Mental status:  Cranial nerves: Facial symmetry is  present. There is good sensation of the face to pinprick and soft touch bilaterally. The strength of the facial muscles and the muscles to head turning and shoulder shrug are normal bilaterally. Speech is well enunciated, no aphasia or dysarthria is noted. Extraocular movements are full. Visual fields are full.  Motor: The motor testing reveals 5 over 5 strength of all 4 extremities. Good symmetric motor tone is noted throughout.  Sensory: Sensory testing is intact to pinprick, soft touch, vibration sensation, and position sense on all 4  extremities. No evidence of extinction is noted.  Coordination: Cerebellar testing reveals good finger-nose-finger and heel-to-shin bilaterally.  Gait and station: Gait is normal. Tandem gait is slightly unsteady. Romberg is negative. No drift is seen.  Reflexes: Deep tendon reflexes are symmetric in arms, with depressed ankle jerk reflexes bilaterally. There is depression of the left knee jerk relative to the right. Toes are downgoing bilaterally.   Assessment/Plan:  One. Restless leg syndrome  The patient reports a fairly typical history of restless leg syndrome. Her symptoms begin in mid to late afternoon, and persist throughout the rest of the day. The patient has gained some improvement with the ropinirole and clonazepam, and these medications should be continued on a scheduled basis. The patient indicates that her symptoms are daily. The patient is to take 2 mg a ropinirole in mid afternoon, and 2 mg at night. The patient will continue to take 1 mg clonazepam at nighttime. The patient will have blood work done today for a vitamin B12 level and a ferritin level. The patient will followup through this office if needed. The patient indicates that her daughter and her father also had similar symptoms.  Chelsea Palau MD 03/01/2013 10:34 AM  [facility] [address] [place], [place] [address]  Phone [phone] Fax [phone]

## 2013-03-01 LAB — VITAMIN B12: Vitamin B-12: 483 pg/mL (ref 211–946)

## 2013-03-01 LAB — FERRITIN: Ferritin: 88 ng/mL (ref 15–150)

## 2013-03-03 NOTE — Progress Notes (Signed)
Quick Note:  Spoke with patient and relayed results of blood work. Patient understood and had no questions. A copy of the results is being mailed to the pt.  ______

## 2013-03-12 ENCOUNTER — Other Ambulatory Visit: Payer: Self-pay

## 2013-05-28 ENCOUNTER — Other Ambulatory Visit: Payer: Self-pay | Admitting: Gastroenterology

## 2013-06-12 ENCOUNTER — Other Ambulatory Visit: Payer: Self-pay

## 2013-06-26 ENCOUNTER — Ambulatory Visit (INDEPENDENT_AMBULATORY_CARE_PROVIDER_SITE_OTHER): Payer: Medicare Other | Admitting: Podiatry

## 2013-06-26 ENCOUNTER — Encounter: Payer: Self-pay | Admitting: Podiatry

## 2013-06-26 VITALS — BP 121/74 | HR 76 | Resp 16

## 2013-06-26 DIAGNOSIS — M216X9 Other acquired deformities of unspecified foot: Secondary | ICD-10-CM

## 2013-06-26 DIAGNOSIS — B351 Tinea unguium: Secondary | ICD-10-CM

## 2013-06-26 DIAGNOSIS — L84 Corns and callosities: Secondary | ICD-10-CM

## 2013-06-26 NOTE — Patient Instructions (Signed)

## 2013-06-26 NOTE — Progress Notes (Signed)
Subjective:     Patient ID: Chelsea Taylor, female   DOB: 06-15-1941, 72 y.o.   MRN: [id number]  HPI patient states I have a very painful callus on the bottom of my left foot and also my left big toenail has discoloration in it. States it's gotten worse over the last few months   Review of Systems     Objective:   Physical Exam  Nursing note and vitals reviewed. Constitutional: She is oriented to person, place, and time.  Cardiovascular: Intact distal pulses.   Musculoskeletal: Normal range of motion.  Neurological: She is oriented to person, place, and time.  Skin: Skin is warm.   patient is found to have a prominent second metatarsal head left that is developed a thick keratotic lesion on top of it that is very painful when pressed. Left hallux nail is yellow and discolored through the distal three quarters of the nail with what appears to be separation of the nail plate neurovascular status intact with no other issues     Assessment:     Plantarflexed metatarsal with plantar lesion formation of a thick nature. Mycotic damaged nail left hallux    Plan:     Reviewed condition and did H&P. At this point using sharp sterile is limitation debridement of lesion accomplished and do to the prominent metatarsal bone soft orthotics with cut out was recommended and scanned created today. Hallux nail should grow out on its own

## 2013-06-27 ENCOUNTER — Telehealth: Payer: Self-pay | Admitting: *Deleted

## 2013-06-27 NOTE — Telephone Encounter (Signed)
Pt states DR Charlsie Merles had her scanned for orthotics 06/26/2013, the black spot placed on her foot was not in the proper place where the bone sticks out.  Pt also wanted to know the name of the orthotic company will be making.

## 2013-07-17 ENCOUNTER — Telehealth: Payer: Self-pay | Admitting: Neurology

## 2013-07-21 ENCOUNTER — Telehealth: Payer: Self-pay | Admitting: *Deleted

## 2013-07-21 NOTE — Telephone Encounter (Signed)
SCHED APPT 12-24

## 2013-07-21 NOTE — Telephone Encounter (Signed)
Pt asked if orthotic were in.  I referred to scheduler to set pt up with Dr Charlsie Merles to pick up her orthotics.

## 2013-07-24 NOTE — Telephone Encounter (Signed)
Jessica took care of this.

## 2013-07-30 ENCOUNTER — Encounter: Payer: Self-pay | Admitting: Podiatry

## 2013-07-30 ENCOUNTER — Ambulatory Visit (INDEPENDENT_AMBULATORY_CARE_PROVIDER_SITE_OTHER): Payer: Medicare Other | Admitting: Podiatry

## 2013-07-30 VITALS — BP 124/66 | HR 76 | Resp 16

## 2013-07-30 DIAGNOSIS — B351 Tinea unguium: Secondary | ICD-10-CM

## 2013-07-30 DIAGNOSIS — M216X9 Other acquired deformities of unspecified foot: Secondary | ICD-10-CM

## 2013-07-30 NOTE — Progress Notes (Signed)
   Subjective:    Patient ID: Chelsea Taylor, female    DOB: Mar 04, 1941, 72 y.o.   MRN: [id number]  HPI Comments: puo and trim nails      Review of Systems     Objective:   Physical Exam        Assessment & Plan:

## 2013-07-30 NOTE — Progress Notes (Signed)
Subjective:     Patient ID: Chelsea Taylor, female   DOB: May 20, 1941, 72 y.o.   MRN: [id number]  HPI patient presents to pick up her orthotics and states her nails have been thick and she cannot cut them. States they do not hurt   Review of Systems     Objective:   Physical Exam Neurovascular status intact with chronic plantarflexed metatarsals and corn callus formation subcutaneous metatarsal both feet and nail disease with thickness 1-5 of both feet    Assessment:     Plantarflexed metatarsal and nail disease 1-5 both feet    Plan:     Dispensed orthotics with all instructions on usage and discussed chronic corn callus formation. Debridement of nailbeds 1-5 both feet today

## 2013-07-30 NOTE — Patient Instructions (Signed)

## 2013-09-11 ENCOUNTER — Ambulatory Visit (INDEPENDENT_AMBULATORY_CARE_PROVIDER_SITE_OTHER): Payer: Medicare Other | Admitting: Podiatry

## 2013-09-11 ENCOUNTER — Encounter: Payer: Self-pay | Admitting: Podiatry

## 2013-09-11 VITALS — BP 122/50 | HR 64 | Resp 12

## 2013-09-11 DIAGNOSIS — L600 Ingrowing nail: Secondary | ICD-10-CM

## 2013-09-11 DIAGNOSIS — L84 Corns and callosities: Secondary | ICD-10-CM

## 2013-09-11 NOTE — Patient Instructions (Signed)

## 2013-09-11 NOTE — Progress Notes (Signed)
Subjective:     Patient ID: Chelsea Taylor, female   DOB: [birth year], 73 y.o.   MRN: [id number]  HPI patient presents stating I have a painful ingrown toenail on my right foot and the calluses under my feet are still present but improved with the insert   Review of Systems     Objective:   Physical Exam Neurovascular status intact with no health history changes noted and incurvated and lateral border right hallux that is painful with no drainage noted and keratotic lesion plantar aspect of both feet    Assessment:     Ingrown toenail deformity right hallux lateral border and chronic keratotic lesions of both feet    Plan:     Discussed correction of nail and explained risk associated with surgery. Patient wants procedure and today I infiltrated 60 mg Xylocaine Marcaine mixture remove the lateral border exposed matrix and applied chemical phenol 3 applications followed by alcohol lavaged and sterile dressing. Debrided plantar lesion

## 2013-09-12 ENCOUNTER — Telehealth: Payer: Self-pay | Admitting: *Deleted

## 2013-09-12 NOTE — Telephone Encounter (Signed)
Pt states had an ingrown toenail procedure yesterday, and doesn't know how often or long to soak.  I informed pt to soak 2 times a day for at least 2 week and daily for the following 2 weeks and cover the area with an antibacterial bandaid, until the area got a dry hard scab.  Pt agrees.

## 2013-09-18 ENCOUNTER — Telehealth: Payer: Self-pay | Admitting: *Deleted

## 2013-09-18 NOTE — Telephone Encounter (Signed)
Patient states that she noticed some purple discoloration at base of toenail where the lateral border was removed. I advised that this was normal. She states that the area is not sore. Advised to continue wound care until healed

## 2013-10-29 ENCOUNTER — Other Ambulatory Visit: Payer: Self-pay | Admitting: Gastroenterology

## 2014-02-10 ENCOUNTER — Telehealth: Payer: Self-pay | Admitting: *Deleted

## 2014-02-10 NOTE — Telephone Encounter (Signed)
I'm calling concerning fungus on my big toe.  I'd like to know what kind of medicine I can get to put on it so it can go away!  I won't be home this morning but will this afternoon.

## 2014-02-11 NOTE — Telephone Encounter (Signed)
I called the patient.  She asked if there's any medicine she can buy to treat fungus.  I informed her we have something here called Formula 3.  She asked is there anything she can buy over the counter.  I informed her Fungi-nail.  She asked if she could buy it at the drugstore.  I told her yes.  She asked if it worked well.  I told her it's a very slow process.  She stated that's the thing about fungus.  She asked if she could wear nail polish with that.  I advised her not to because it allows the fungus to grow, fungus likes darkness.  She stated okay.

## 2014-05-15 ENCOUNTER — Other Ambulatory Visit: Payer: Self-pay

## 2014-05-15 DIAGNOSIS — I83893 Varicose veins of bilateral lower extremities with other complications: Secondary | ICD-10-CM

## 2014-06-02 ENCOUNTER — Encounter: Payer: Self-pay | Admitting: Vascular Surgery

## 2014-06-03 ENCOUNTER — Ambulatory Visit (HOSPITAL_COMMUNITY)
Admission: RE | Admit: 2014-06-03 | Discharge: 2014-06-03 | Disposition: A | Discharge status: Home / Self Care | Payer: Medicare Other | Source: Ambulatory Visit | Attending: Vascular Surgery | Admitting: Vascular Surgery

## 2014-06-03 ENCOUNTER — Encounter: Payer: Self-pay | Admitting: Vascular Surgery

## 2014-06-03 ENCOUNTER — Ambulatory Visit (INDEPENDENT_AMBULATORY_CARE_PROVIDER_SITE_OTHER): Payer: Medicare Other | Admitting: Vascular Surgery

## 2014-06-03 ENCOUNTER — Other Ambulatory Visit: Payer: Self-pay | Admitting: Vascular Surgery

## 2014-06-03 VITALS — BP 131/77 | HR 65 | Ht 66.25 in | Wt 191.9 lb

## 2014-06-03 DIAGNOSIS — I83893 Varicose veins of bilateral lower extremities with other complications: Secondary | ICD-10-CM | POA: Diagnosis not present

## 2014-06-03 DIAGNOSIS — I83899 Varicose veins of unspecified lower extremities with other complications: Secondary | ICD-10-CM

## 2014-06-03 DIAGNOSIS — R609 Edema, unspecified: Secondary | ICD-10-CM | POA: Insufficient documentation

## 2014-06-03 NOTE — Assessment & Plan Note (Signed)
The patient has mild bilateral lower extremity edema. However her duplex scan is unremarkable without evidence of DVT or significant reflux. We have discussed the importance of intermittent leg elevation in the proper positioning for this. I have also written her a prescription for compression stockings with a mild gradient of 15-20 mmHg. I have encouraged her to avoid prolonged sitting and standing. I have encouraged her to ambulate and exercise as much as possible. I will see her back as needed.

## 2014-06-03 NOTE — Progress Notes (Signed)
Patient ID: Chelsea Taylor, female   DOB: 19-May-1941, 73 y.o.   MRN: [id number]  Reason for Consult: Varicose veins   Referred by Jani Gravel, MD  Subjective:     HPI:  Chelsea Taylor is a 73 y.o. female who was referred for evaluation of bilateral lower extremity swelling. She denies any previous history of DVT or phlebitis. She has not had any significant problems with varicose veins. She does experience an aching pain in her legs which is associated with standing and relieved with elevation. She does not wear compression stockings. Her symptoms have been stable over the last year. She notices mild bilateral lower extremity swelling which is improved after she has been recumbent at night.  Past Medical History  Diagnosis Date  . Hypertension   . Fibromyalgia   . Restless legs syndrome (RLS) 02/28/2013  . Dyslipidemia   . Hypothyroid   . Collagenous colitis   . Degenerative arthritis     Knees  . Sleep apnea    Family History  Problem Relation Age of Onset  . Heart failure Mother   . Heart disease Mother   . Hyperlipidemia Mother   . Heart attack Mother   . Colon cancer Father   . Cancer Father   . Hypertension Father   . Heart attack Brother   . Heart disease Brother   . Diabetes Daughter    Past Surgical History  Procedure Laterality Date  . Hammer toe surgery  2014  . Back surgery  2012  . Hammer toe surgery  2007  . Cataract extraction    . Tonsillectomy    . Appendectomy    . Dilation and curettage of uterus    . Hysteroscopy    . Breast biopsy Left     Fibroadenoma  . Foot surgery Bilateral     multiple    Short Social History:  History  Substance Use Topics  . Smoking status: Never Smoker   . Smokeless tobacco: Never Used  . Alcohol Use: Yes     Comment: Consumes alcohol twice per year    Allergies  Allergen Reactions  . Adhesive [Tape]   . Codeine   . Neosporin [Neomycin-Bacitracin Zn-Polymyx]   . Penicillins     Current Outpatient  Prescriptions  Medication Sig Dispense Refill  . Ascorbic Acid (VITAMIN C) 1000 MG tablet Take 1,000 mg by mouth daily.      Marland Kitchen aspirin 81 MG tablet Take 81 mg by mouth daily.      . B Complex Vitamins (B-COMPLEX/B-12 PO) Take by mouth.      . bisoprolol (ZEBETA) 5 MG tablet Take 5 mg by mouth daily.      . calcium gluconate 500 MG tablet Take 500 mg by mouth 2 (two) times daily.       . cholecalciferol (VITAMIN D) 1000 UNITS tablet Take 1,000 Units by mouth daily.      . clonazePAM (KLONOPIN) 1 MG tablet Take 1 mg by mouth at bedtime.       . CoenzymeQ10-Isoleucine-Glycine (CO Q-10) 100-50-25 MG TB24 Take 100 mg by mouth daily.      . fish oil-omega-3 fatty acids 1000 MG capsule Take 2 g by mouth daily.      Marland Kitchen FOLIC ACID PO Take by mouth.      . Glucosamine HCl 1500 MG TABS Take 1,500 mg by mouth daily.      Marland Kitchen losartan (COZAAR) 100 MG tablet Take 100 mg by mouth daily.      Marland Kitchen  magnesium oxide (MAG-OX) 400 MG tablet Take 400 mg by mouth daily.      . Multiple Vitamin (MULTIVITAMIN) tablet Take 1 tablet by mouth daily.      . Potassium Gluconate 595 MG CAPS Take 595 capsules by mouth daily.      . Red Yeast Rice 600 MG CAPS Take 600 mg by mouth 2 (two) times daily.      Marland Kitchen rOPINIRole (REQUIP) 1 MG tablet Take 2 mg by mouth. 2 tablets in afternoon and 3 tablets at bedtime      . SYNTHROID 137 MCG tablet Take 137 tablets by mouth daily.       No current facility-administered medications for this visit.    Review of Systems  Constitutional: Negative for chills and fever.  Eyes: Negative for loss of vision.  Respiratory: Negative for cough and wheezing.  Cardiovascular: Positive for leg swelling. Negative for chest pain, chest tightness, claudication, dyspnea with exertion, orthopnea and palpitations.  GI: Negative for blood in stool and vomiting.  GU: Negative for dysuria and hematuria.  Musculoskeletal: Negative for leg pain, joint pain and myalgias.  Skin: Negative for rash and wound.    Neurological: Positive for dizziness. Negative for speech difficulty.  Hematologic: Negative for bruises/bleeds easily. Psychiatric: Negative for depressed mood.        Objective:  Objective  Filed Vitals:   06/03/14 1008  BP: 131/77  Pulse: 65  Height: 5' 6.25" (1.683 m)  Weight: 191 lb 14.4 oz (87.045 kg)  SpO2: 95%   Body mass index is 30.73 kg/(m^2).  Physical Exam  Constitutional: She is oriented to person, place, and time. She appears well-developed and well-nourished.  HENT:  Head: Normocephalic and atraumatic.  Neck: Neck supple. No JVD present. No thyromegaly present.  Cardiovascular: Normal rate, regular rhythm and normal heart sounds.  Exam reveals no friction rub.   No murmur heard. Pulmonary/Chest: Breath sounds normal. She has no wheezes. She has no rales.  Abdominal: Soft. Bowel sounds are normal. There is no tenderness.  Musculoskeletal: Normal range of motion. She exhibits edema.  She has mild bilateral lower extremity swelling  Lymphadenopathy:    She has no cervical adenopathy.  Neurological: She is alert and oriented to person, place, and time. She has normal strength. No sensory deficit.  Skin: No lesion and no rash noted.  Psychiatric: She has a normal mood and affect.    Data: I have inability interpreted her bilateral lower extremity venous duplex can. She has no evidence of DVT bilaterally. She has no evidence of reflux in the greater saphenous veins or short saphenous veins bilaterally. She does have some reflux in the popliteal vein on the left which is not significant.      Assessment/Plan:    Edema The patient has mild bilateral lower extremity edema. However her duplex scan is unremarkable without evidence of DVT or significant reflux. We have discussed the importance of intermittent leg elevation in the proper positioning for this. I have also written her a prescription for compression stockings with a mild gradient of 15-20 mmHg. I have  encouraged her to avoid prolonged sitting and standing. I have encouraged her to ambulate and exercise as much as possible. I will see her back as needed.    Angelia Mould MD [facility]

## 2014-06-03 NOTE — Patient Instructions (Signed)
Varicose Veins Varicose veins are veins that have become enlarged and twisted. CAUSES This condition is the result of valves in the veins not working properly. Valves in the veins help return blood from the leg to the heart. If these valves are damaged, blood flows backwards and backs up into the veins in the leg near the skin. This causes the veins to become larger. People who are on their feet a lot, who are pregnant, or who are overweight are more likely to develop varicose veins. SYMPTOMS   Bulging, twisted-appearing, bluish veins, most commonly found on the legs.  Leg pain or a feeling of heaviness. These symptoms may be worse at the end of the day.  Leg swelling.  Skin color changes. DIAGNOSIS  Varicose veins can usually be diagnosed with an exam of your legs by your caregiver. He or she may recommend an ultrasound of your leg veins. TREATMENT  Most varicose veins can be treated at home.However, other treatments are available for people who have persistent symptoms or who want to treat the cosmetic appearance of the varicose veins. These include:  Laser treatment of very small varicose veins.  Medicine that is shot (injected) into the vein. This medicine hardens the walls of the vein and closes off the vein. This treatment is called sclerotherapy. Afterwards, you may need to wear clothing or bandages that apply pressure.  Surgery. HOME CARE INSTRUCTIONS   Do not stand or sit in one position for long periods of time. Do not sit with your legs crossed. Rest with your legs raised during the day.  Wear elastic stockings or support hose. Do not wear other tight, encircling garments around the legs, pelvis, or waist.  Walk as much as possible to increase blood flow.  Raise the foot of your bed at night with 2-inch blocks.  If you get a cut in the skin over the vein and the vein bleeds, lie down with your leg raised and press on it with a clean cloth until the bleeding stops. Then  place a bandage (dressing) on the cut. See your caregiver if it continues to bleed or needs stitches. SEEK MEDICAL CARE IF:   The skin around your ankle starts to break down.  You have pain, redness, tenderness, or hard swelling developing in your leg over a vein.  You are uncomfortable due to leg pain. Document Released: 05/03/2005 Document Revised: 10/16/2011 Document Reviewed: 09/19/2010 [facility] Patient Information 2015 [place], [place]. This information is not intended to replace advice given to you by your health care provider. Make sure you discuss any questions you have with your health care provider.

## 2014-08-25 DIAGNOSIS — M79674 Pain in right toe(s): Secondary | ICD-10-CM | POA: Diagnosis not present

## 2014-08-25 DIAGNOSIS — M2041 Other hammer toe(s) (acquired), right foot: Secondary | ICD-10-CM | POA: Diagnosis not present

## 2014-08-25 DIAGNOSIS — M2042 Other hammer toe(s) (acquired), left foot: Secondary | ICD-10-CM | POA: Diagnosis not present

## 2014-08-25 DIAGNOSIS — D2371 Other benign neoplasm of skin of right lower limb, including hip: Secondary | ICD-10-CM | POA: Diagnosis not present

## 2014-08-25 DIAGNOSIS — M79675 Pain in left toe(s): Secondary | ICD-10-CM | POA: Diagnosis not present

## 2014-08-25 DIAGNOSIS — D2372 Other benign neoplasm of skin of left lower limb, including hip: Secondary | ICD-10-CM | POA: Diagnosis not present

## 2014-09-02 DIAGNOSIS — R739 Hyperglycemia, unspecified: Secondary | ICD-10-CM | POA: Diagnosis not present

## 2014-09-02 DIAGNOSIS — E039 Hypothyroidism, unspecified: Secondary | ICD-10-CM | POA: Diagnosis not present

## 2014-09-02 DIAGNOSIS — I10 Essential (primary) hypertension: Secondary | ICD-10-CM | POA: Diagnosis not present

## 2014-09-08 DIAGNOSIS — Z0000 Encounter for general adult medical examination without abnormal findings: Secondary | ICD-10-CM | POA: Diagnosis not present

## 2014-11-10 DIAGNOSIS — H40013 Open angle with borderline findings, low risk, bilateral: Secondary | ICD-10-CM | POA: Diagnosis not present

## 2014-12-04 DIAGNOSIS — M1711 Unilateral primary osteoarthritis, right knee: Secondary | ICD-10-CM | POA: Diagnosis not present

## 2014-12-11 DIAGNOSIS — M1711 Unilateral primary osteoarthritis, right knee: Secondary | ICD-10-CM | POA: Diagnosis not present

## 2014-12-18 DIAGNOSIS — M1711 Unilateral primary osteoarthritis, right knee: Secondary | ICD-10-CM | POA: Diagnosis not present

## 2015-01-21 DIAGNOSIS — R3911 Hesitancy of micturition: Secondary | ICD-10-CM | POA: Diagnosis not present

## 2015-01-21 DIAGNOSIS — N8111 Cystocele, midline: Secondary | ICD-10-CM | POA: Diagnosis not present

## 2015-01-21 DIAGNOSIS — N362 Urethral caruncle: Secondary | ICD-10-CM | POA: Diagnosis not present

## 2015-03-09 DIAGNOSIS — M71571 Other bursitis, not elsewhere classified, right ankle and foot: Secondary | ICD-10-CM | POA: Diagnosis not present

## 2015-03-09 DIAGNOSIS — M2041 Other hammer toe(s) (acquired), right foot: Secondary | ICD-10-CM | POA: Diagnosis not present

## 2015-03-09 DIAGNOSIS — D2371 Other benign neoplasm of skin of right lower limb, including hip: Secondary | ICD-10-CM | POA: Diagnosis not present

## 2015-03-09 DIAGNOSIS — D2372 Other benign neoplasm of skin of left lower limb, including hip: Secondary | ICD-10-CM | POA: Diagnosis not present

## 2015-03-09 DIAGNOSIS — M792 Neuralgia and neuritis, unspecified: Secondary | ICD-10-CM | POA: Diagnosis not present

## 2015-03-11 DIAGNOSIS — E039 Hypothyroidism, unspecified: Secondary | ICD-10-CM | POA: Diagnosis not present

## 2015-03-11 DIAGNOSIS — I10 Essential (primary) hypertension: Secondary | ICD-10-CM | POA: Diagnosis not present

## 2015-03-12 DIAGNOSIS — D2272 Melanocytic nevi of left lower limb, including hip: Secondary | ICD-10-CM | POA: Diagnosis not present

## 2015-03-12 DIAGNOSIS — D2262 Melanocytic nevi of left upper limb, including shoulder: Secondary | ICD-10-CM | POA: Diagnosis not present

## 2015-03-12 DIAGNOSIS — L918 Other hypertrophic disorders of the skin: Secondary | ICD-10-CM | POA: Diagnosis not present

## 2015-03-12 DIAGNOSIS — D2261 Melanocytic nevi of right upper limb, including shoulder: Secondary | ICD-10-CM | POA: Diagnosis not present

## 2015-03-12 DIAGNOSIS — D225 Melanocytic nevi of trunk: Secondary | ICD-10-CM | POA: Diagnosis not present

## 2015-03-30 DIAGNOSIS — T8189XA Other complications of procedures, not elsewhere classified, initial encounter: Secondary | ICD-10-CM | POA: Diagnosis not present

## 2015-03-31 DIAGNOSIS — E780 Pure hypercholesterolemia: Secondary | ICD-10-CM | POA: Diagnosis not present

## 2015-03-31 DIAGNOSIS — R739 Hyperglycemia, unspecified: Secondary | ICD-10-CM | POA: Diagnosis not present

## 2015-03-31 DIAGNOSIS — I10 Essential (primary) hypertension: Secondary | ICD-10-CM | POA: Diagnosis not present

## 2015-03-31 DIAGNOSIS — E039 Hypothyroidism, unspecified: Secondary | ICD-10-CM | POA: Diagnosis not present

## 2015-04-13 DIAGNOSIS — M7741 Metatarsalgia, right foot: Secondary | ICD-10-CM | POA: Diagnosis not present

## 2015-04-13 DIAGNOSIS — L603 Nail dystrophy: Secondary | ICD-10-CM | POA: Diagnosis not present

## 2015-04-13 DIAGNOSIS — I739 Peripheral vascular disease, unspecified: Secondary | ICD-10-CM | POA: Diagnosis not present

## 2015-04-13 DIAGNOSIS — L97519 Non-pressure chronic ulcer of other part of right foot with unspecified severity: Secondary | ICD-10-CM | POA: Diagnosis not present

## 2015-05-13 DIAGNOSIS — Z1231 Encounter for screening mammogram for malignant neoplasm of breast: Secondary | ICD-10-CM | POA: Diagnosis not present

## 2015-05-21 DIAGNOSIS — Z23 Encounter for immunization: Secondary | ICD-10-CM | POA: Diagnosis not present

## 2015-06-15 ENCOUNTER — Other Ambulatory Visit: Payer: Self-pay | Admitting: Gastroenterology

## 2015-06-15 DIAGNOSIS — K52839 Microscopic colitis, unspecified: Secondary | ICD-10-CM | POA: Diagnosis not present

## 2015-06-15 DIAGNOSIS — R1310 Dysphagia, unspecified: Secondary | ICD-10-CM | POA: Diagnosis not present

## 2015-06-18 ENCOUNTER — Ambulatory Visit
Admission: RE | Admit: 2015-06-18 | Discharge: 2015-06-18 | Disposition: A | Discharge status: Home / Self Care | Payer: Self-pay | Source: Ambulatory Visit | Attending: Gastroenterology | Admitting: Gastroenterology

## 2015-06-18 DIAGNOSIS — K219 Gastro-esophageal reflux disease without esophagitis: Secondary | ICD-10-CM | POA: Diagnosis not present

## 2015-06-18 DIAGNOSIS — R1310 Dysphagia, unspecified: Secondary | ICD-10-CM

## 2015-06-23 DIAGNOSIS — H6122 Impacted cerumen, left ear: Secondary | ICD-10-CM | POA: Diagnosis not present

## 2015-06-23 DIAGNOSIS — R1310 Dysphagia, unspecified: Secondary | ICD-10-CM | POA: Diagnosis not present

## 2015-06-28 ENCOUNTER — Other Ambulatory Visit: Payer: Self-pay | Admitting: Otolaryngology

## 2015-06-28 DIAGNOSIS — R1310 Dysphagia, unspecified: Secondary | ICD-10-CM

## 2015-06-29 DIAGNOSIS — M65871 Other synovitis and tenosynovitis, right ankle and foot: Secondary | ICD-10-CM | POA: Diagnosis not present

## 2015-06-29 DIAGNOSIS — D2372 Other benign neoplasm of skin of left lower limb, including hip: Secondary | ICD-10-CM | POA: Diagnosis not present

## 2015-06-29 DIAGNOSIS — I739 Peripheral vascular disease, unspecified: Secondary | ICD-10-CM | POA: Diagnosis not present

## 2015-06-29 DIAGNOSIS — M65872 Other synovitis and tenosynovitis, left ankle and foot: Secondary | ICD-10-CM | POA: Diagnosis not present

## 2015-06-29 DIAGNOSIS — D2371 Other benign neoplasm of skin of right lower limb, including hip: Secondary | ICD-10-CM | POA: Diagnosis not present

## 2015-07-08 ENCOUNTER — Ambulatory Visit
Admission: RE | Admit: 2015-07-08 | Discharge: 2015-07-08 | Disposition: A | Discharge status: Home / Self Care | Payer: Medicare Other | Source: Ambulatory Visit | Attending: Otolaryngology | Admitting: Otolaryngology

## 2015-07-08 DIAGNOSIS — R1310 Dysphagia, unspecified: Secondary | ICD-10-CM

## 2015-07-08 MED ORDER — IOPAMIDOL (ISOVUE-300) INJECTION 61%
75.0000 mL | Freq: Once | INTRAVENOUS | Status: AC | PRN
  Administered 2015-07-08: 75 mL via INTRAVENOUS

## 2015-07-27 DIAGNOSIS — M79609 Pain in unspecified limb: Secondary | ICD-10-CM | POA: Diagnosis not present

## 2015-07-27 DIAGNOSIS — D2371 Other benign neoplasm of skin of right lower limb, including hip: Secondary | ICD-10-CM | POA: Diagnosis not present

## 2015-07-27 DIAGNOSIS — D2372 Other benign neoplasm of skin of left lower limb, including hip: Secondary | ICD-10-CM | POA: Diagnosis not present

## 2015-08-10 DIAGNOSIS — M7742 Metatarsalgia, left foot: Secondary | ICD-10-CM | POA: Diagnosis not present

## 2015-08-10 DIAGNOSIS — D2372 Other benign neoplasm of skin of left lower limb, including hip: Secondary | ICD-10-CM | POA: Diagnosis not present

## 2015-08-10 DIAGNOSIS — M7741 Metatarsalgia, right foot: Secondary | ICD-10-CM | POA: Diagnosis not present

## 2015-08-10 DIAGNOSIS — D2371 Other benign neoplasm of skin of right lower limb, including hip: Secondary | ICD-10-CM | POA: Diagnosis not present

## 2015-08-24 DIAGNOSIS — M1711 Unilateral primary osteoarthritis, right knee: Secondary | ICD-10-CM | POA: Diagnosis not present

## 2015-09-09 DIAGNOSIS — E039 Hypothyroidism, unspecified: Secondary | ICD-10-CM | POA: Diagnosis not present

## 2015-09-09 DIAGNOSIS — I10 Essential (primary) hypertension: Secondary | ICD-10-CM | POA: Diagnosis not present

## 2015-09-09 DIAGNOSIS — M810 Age-related osteoporosis without current pathological fracture: Secondary | ICD-10-CM | POA: Diagnosis not present

## 2015-09-14 DIAGNOSIS — I10 Essential (primary) hypertension: Secondary | ICD-10-CM | POA: Diagnosis not present

## 2015-09-14 DIAGNOSIS — L84 Corns and callosities: Secondary | ICD-10-CM | POA: Diagnosis not present

## 2015-09-14 DIAGNOSIS — E7800 Pure hypercholesterolemia, unspecified: Secondary | ICD-10-CM | POA: Diagnosis not present

## 2015-09-14 DIAGNOSIS — R739 Hyperglycemia, unspecified: Secondary | ICD-10-CM | POA: Diagnosis not present

## 2015-09-14 DIAGNOSIS — E039 Hypothyroidism, unspecified: Secondary | ICD-10-CM | POA: Diagnosis not present

## 2015-11-18 DIAGNOSIS — L602 Onychogryphosis: Secondary | ICD-10-CM | POA: Diagnosis not present

## 2015-11-18 DIAGNOSIS — M79672 Pain in left foot: Secondary | ICD-10-CM | POA: Diagnosis not present

## 2015-11-18 DIAGNOSIS — L84 Corns and callosities: Secondary | ICD-10-CM | POA: Diagnosis not present

## 2015-11-18 DIAGNOSIS — M79671 Pain in right foot: Secondary | ICD-10-CM | POA: Diagnosis not present

## 2015-11-18 DIAGNOSIS — M7742 Metatarsalgia, left foot: Secondary | ICD-10-CM | POA: Diagnosis not present

## 2015-11-24 DIAGNOSIS — H40013 Open angle with borderline findings, low risk, bilateral: Secondary | ICD-10-CM | POA: Diagnosis not present

## 2015-12-02 DIAGNOSIS — M7742 Metatarsalgia, left foot: Secondary | ICD-10-CM | POA: Diagnosis not present

## 2015-12-02 DIAGNOSIS — L84 Corns and callosities: Secondary | ICD-10-CM | POA: Diagnosis not present

## 2015-12-02 DIAGNOSIS — M79672 Pain in left foot: Secondary | ICD-10-CM | POA: Diagnosis not present

## 2015-12-02 DIAGNOSIS — M79671 Pain in right foot: Secondary | ICD-10-CM | POA: Diagnosis not present

## 2017-05-31 ENCOUNTER — Encounter (HOSPITAL_COMMUNITY): Payer: Self-pay | Admitting: Nurse Practitioner

## 2017-05-31 ENCOUNTER — Observation Stay (HOSPITAL_COMMUNITY)
Admission: EM | Admit: 2017-05-31 | Discharge: 2017-06-03 | Disposition: A | Discharge status: Home / Self Care | Payer: Medicare Other | Attending: Internal Medicine | Admitting: Internal Medicine

## 2017-05-31 ENCOUNTER — Emergency Department (HOSPITAL_COMMUNITY): Payer: Medicare Other

## 2017-05-31 DIAGNOSIS — R700 Elevated erythrocyte sedimentation rate: Secondary | ICD-10-CM | POA: Diagnosis not present

## 2017-05-31 DIAGNOSIS — Z79899 Other long term (current) drug therapy: Secondary | ICD-10-CM | POA: Insufficient documentation

## 2017-05-31 DIAGNOSIS — I10 Essential (primary) hypertension: Secondary | ICD-10-CM | POA: Diagnosis present

## 2017-05-31 DIAGNOSIS — J9601 Acute respiratory failure with hypoxia: Secondary | ICD-10-CM | POA: Diagnosis not present

## 2017-05-31 DIAGNOSIS — J90 Pleural effusion, not elsewhere classified: Principal | ICD-10-CM

## 2017-05-31 DIAGNOSIS — R0602 Shortness of breath: Secondary | ICD-10-CM

## 2017-05-31 DIAGNOSIS — G2581 Restless legs syndrome: Secondary | ICD-10-CM | POA: Diagnosis not present

## 2017-05-31 DIAGNOSIS — I2510 Atherosclerotic heart disease of native coronary artery without angina pectoris: Secondary | ICD-10-CM | POA: Insufficient documentation

## 2017-05-31 DIAGNOSIS — D72829 Elevated white blood cell count, unspecified: Secondary | ICD-10-CM | POA: Insufficient documentation

## 2017-05-31 DIAGNOSIS — I700 Atherosclerosis of aorta: Secondary | ICD-10-CM | POA: Insufficient documentation

## 2017-05-31 DIAGNOSIS — E785 Hyperlipidemia, unspecified: Secondary | ICD-10-CM | POA: Diagnosis not present

## 2017-05-31 DIAGNOSIS — Z7982 Long term (current) use of aspirin: Secondary | ICD-10-CM | POA: Diagnosis not present

## 2017-05-31 DIAGNOSIS — I313 Pericardial effusion (noninflammatory): Secondary | ICD-10-CM | POA: Diagnosis not present

## 2017-05-31 DIAGNOSIS — R079 Chest pain, unspecified: Secondary | ICD-10-CM

## 2017-05-31 DIAGNOSIS — E039 Hypothyroidism, unspecified: Secondary | ICD-10-CM | POA: Diagnosis present

## 2017-05-31 DIAGNOSIS — K52839 Microscopic colitis, unspecified: Secondary | ICD-10-CM | POA: Diagnosis present

## 2017-05-31 DIAGNOSIS — G4730 Sleep apnea, unspecified: Secondary | ICD-10-CM | POA: Insufficient documentation

## 2017-05-31 DIAGNOSIS — J9600 Acute respiratory failure, unspecified whether with hypoxia or hypercapnia: Secondary | ICD-10-CM | POA: Diagnosis present

## 2017-05-31 DIAGNOSIS — I3139 Other pericardial effusion (noninflammatory): Secondary | ICD-10-CM

## 2017-05-31 LAB — I-STAT CHEM 8, ED
BUN: 18 mg/dL (ref 6–20)
CHLORIDE: 96 mmol/L — AB (ref 101–111)
Calcium, Ion: 1.18 mmol/L (ref 1.15–1.40)
Creatinine, Ser: 0.9 mg/dL (ref 0.44–1.00)
Glucose, Bld: 155 mg/dL — ABNORMAL HIGH (ref 65–99)
HCT: 37 % (ref 36.0–46.0)
HEMOGLOBIN: 12.6 g/dL (ref 12.0–15.0)
Potassium: 3.9 mmol/L (ref 3.5–5.1)
Sodium: 134 mmol/L — ABNORMAL LOW (ref 135–145)
TCO2: 27 mmol/L (ref 22–32)

## 2017-05-31 LAB — BASIC METABOLIC PANEL
Anion gap: 8 (ref 5–15)
BUN: 16 mg/dL (ref 6–20)
CO2: 26 mmol/L (ref 22–32)
CREATININE: 0.9 mg/dL (ref 0.44–1.00)
Calcium: 9 mg/dL (ref 8.9–10.3)
Chloride: 98 mmol/L — ABNORMAL LOW (ref 101–111)
GFR calc non Af Amer: 60 mL/min — ABNORMAL LOW (ref >60)
GLUCOSE: 158 mg/dL — AB (ref 65–99)
Potassium: 3.9 mmol/L (ref 3.5–5.1)
Sodium: 132 mmol/L — ABNORMAL LOW (ref 135–145)

## 2017-05-31 LAB — CBC
HCT: 35.8 % — ABNORMAL LOW (ref 36.0–46.0)
Hemoglobin: 12.1 g/dL (ref 12.0–15.0)
MCH: 31.9 pg (ref 26.0–34.0)
MCHC: 33.8 g/dL (ref 30.0–36.0)
MCV: 94.5 fL (ref 78.0–100.0)
PLATELETS: 367 10*3/uL (ref 150–400)
RBC: 3.79 MIL/uL — AB (ref 3.87–5.11)
RDW: 12.9 % (ref 11.5–15.5)
WBC: 16.6 10*3/uL — ABNORMAL HIGH (ref 4.0–10.5)

## 2017-05-31 LAB — TSH: TSH: 0.918 u[IU]/mL (ref 0.350–4.500)

## 2017-05-31 LAB — TROPONIN I: Troponin I: <0.03 ng/mL (ref <0.03)

## 2017-05-31 LAB — I-STAT TROPONIN, ED: Troponin i, poc: 0 ng/mL (ref 0.00–0.08)

## 2017-05-31 LAB — BRAIN NATRIURETIC PEPTIDE: B Natriuretic Peptide: 134.3 pg/mL — ABNORMAL HIGH (ref 0.0–100.0)

## 2017-05-31 MED ORDER — MORPHINE SULFATE (PF) 4 MG/ML IV SOLN
2.0000 mg | Freq: Once | INTRAVENOUS | Status: AC
  Administered 2017-05-31: 2 mg via INTRAVENOUS
  Filled 2017-05-31: qty 1

## 2017-05-31 MED ORDER — GLUCOSAMINE HCL 1500 MG PO TABS: 1500.0000 mg | ORAL_TABLET | Freq: Two times a day (BID) | ORAL | Status: DC

## 2017-05-31 MED ORDER — OMEGA-3-ACID ETHYL ESTERS 1 G PO CAPS
2.0000 g | ORAL_CAPSULE | Freq: Two times a day (BID) | ORAL | Status: DC
  Administered 2017-06-01: 2 g via ORAL
  Administered 2017-06-01: 1 g via ORAL
  Administered 2017-06-02 – 2017-06-03 (×3): 2 g via ORAL
  Filled 2017-05-31 (×5): qty 2

## 2017-05-31 MED ORDER — VITAMIN B-12 100 MCG PO TABS
100.0000 ug | ORAL_TABLET | Freq: Every day | ORAL | Status: DC
  Administered 2017-05-31 – 2017-06-02 (×3): 100 ug via ORAL
  Filled 2017-05-31 (×3): qty 1

## 2017-05-31 MED ORDER — ONDANSETRON HCL 4 MG/2ML IJ SOLN
4.0000 mg | Freq: Once | INTRAMUSCULAR | Status: AC
  Administered 2017-05-31: 4 mg via INTRAVENOUS
  Filled 2017-05-31: qty 2

## 2017-05-31 MED ORDER — ACETAMINOPHEN 325 MG PO TABS
650.0000 mg | ORAL_TABLET | Freq: Four times a day (QID) | ORAL | Status: DC | PRN
  Administered 2017-06-02 – 2017-06-03 (×2): 650 mg via ORAL
  Filled 2017-05-31 (×3): qty 2

## 2017-05-31 MED ORDER — ONDANSETRON HCL 4 MG PO TABS: 4.0000 mg | ORAL_TABLET | Freq: Four times a day (QID) | ORAL | Status: DC | PRN

## 2017-05-31 MED ORDER — FOLIC ACID 1 MG PO TABS
1.0000 mg | ORAL_TABLET | Freq: Every day | ORAL | Status: DC
  Administered 2017-05-31 – 2017-06-02 (×3): 1 mg via ORAL
  Filled 2017-05-31 (×3): qty 1

## 2017-05-31 MED ORDER — KETOROLAC TROMETHAMINE 15 MG/ML IJ SOLN
15.0000 mg | Freq: Once | INTRAMUSCULAR | Status: AC
  Administered 2017-05-31: 15 mg via INTRAVENOUS
  Filled 2017-05-31: qty 1

## 2017-05-31 MED ORDER — MAGNESIUM OXIDE 400 (241.3 MG) MG PO TABS
400.0000 mg | ORAL_TABLET | Freq: Every day | ORAL | Status: DC
  Administered 2017-06-01 – 2017-06-02 (×2): 400 mg via ORAL
  Filled 2017-05-31 (×3): qty 1

## 2017-05-31 MED ORDER — ACETAMINOPHEN 650 MG RE SUPP: 650.0000 mg | Freq: Four times a day (QID) | RECTAL | Status: DC | PRN

## 2017-05-31 MED ORDER — SULFASALAZINE 500 MG PO TBEC
1500.0000 mg | DELAYED_RELEASE_TABLET | Freq: Two times a day (BID) | ORAL | Status: DC
  Administered 2017-06-01 – 2017-06-03 (×5): 1500 mg via ORAL
  Filled 2017-05-31 (×7): qty 3

## 2017-05-31 MED ORDER — POTASSIUM GLUCONATE 595 MG PO CAPS: 595.0000 | ORAL_CAPSULE | Freq: Every day | ORAL | Status: DC

## 2017-05-31 MED ORDER — CALCIUM CARBONATE 1250 (500 CA) MG PO TABS
1250.0000 mg | ORAL_TABLET | Freq: Two times a day (BID) | ORAL | Status: DC
  Administered 2017-06-01 – 2017-06-03 (×5): 1250 mg via ORAL
  Filled 2017-05-31 (×5): qty 1

## 2017-05-31 MED ORDER — IOPAMIDOL (ISOVUE-370) INJECTION 76%
INTRAVENOUS | Status: AC
  Administered 2017-05-31: 100 mL
  Filled 2017-05-31: qty 100

## 2017-05-31 MED ORDER — VITAMIN C 500 MG PO TABS
500.0000 mg | ORAL_TABLET | Freq: Every day | ORAL | Status: DC
  Administered 2017-06-01: 500 mg via ORAL
  Filled 2017-05-31: qty 1

## 2017-05-31 MED ORDER — ADULT MULTIVITAMIN W/MINERALS CH
1.0000 | ORAL_TABLET | Freq: Every day | ORAL | Status: DC
  Administered 2017-06-01 – 2017-06-03 (×3): 1 via ORAL
  Filled 2017-05-31 (×3): qty 1

## 2017-05-31 MED ORDER — ASPIRIN EC 81 MG PO TBEC
81.0000 mg | DELAYED_RELEASE_TABLET | Freq: Every day | ORAL | Status: DC
  Administered 2017-05-31 – 2017-06-02 (×3): 81 mg via ORAL
  Filled 2017-05-31 (×3): qty 1

## 2017-05-31 MED ORDER — CO Q-10 100-50-25 MG PO TB24: 100.0000 mg | ORAL_TABLET | Freq: Every day | ORAL | Status: DC

## 2017-05-31 MED ORDER — VITAMIN D 1000 UNITS PO TABS
2000.0000 [IU] | ORAL_TABLET | Freq: Every day | ORAL | Status: DC
  Administered 2017-06-01 – 2017-06-03 (×3): 2000 [IU] via ORAL
  Filled 2017-05-31 (×3): qty 2

## 2017-05-31 MED ORDER — ROPINIROLE HCL 1 MG PO TABS
3.0000 mg | ORAL_TABLET | Freq: Once | ORAL | Status: AC
  Administered 2017-05-31: 3 mg via ORAL
  Filled 2017-05-31: qty 3

## 2017-05-31 MED ORDER — ONDANSETRON HCL 4 MG/2ML IJ SOLN: 4.0000 mg | Freq: Four times a day (QID) | INTRAMUSCULAR | Status: DC | PRN

## 2017-05-31 MED ORDER — BISOPROLOL FUMARATE 5 MG PO TABS
5.0000 mg | ORAL_TABLET | Freq: Every day | ORAL | Status: DC
Start: 2017-06-01 — End: 2017-06-01
  Filled 2017-05-31: qty 1

## 2017-05-31 MED ORDER — LOSARTAN POTASSIUM 50 MG PO TABS
100.0000 mg | ORAL_TABLET | Freq: Every day | ORAL | Status: DC
  Administered 2017-05-31: 100 mg via ORAL
  Filled 2017-05-31: qty 2

## 2017-05-31 NOTE — Progress Notes (Signed)
PHARMACIST - PHYSICIAN ORDER COMMUNICATION  CONCERNING: P&T Medication Policy on Herbal Medications  DESCRIPTION:  This patient's order for: CoQ10 and Glucosamine  has been noted.  This product(s) is classified as an "herbal" or natural product. Due to a lack of definitive safety studies or FDA approval, nonstandard manufacturing practices, plus the potential risk of unknown drug-drug interactions while on inpatient medications, the Pharmacy and Therapeutics Committee does not permit the use of "herbal" or natural products of this type within [facility].   ACTION TAKEN: The pharmacy department is unable to verify this order at this time and your patient has been informed of this safety policy. Please reevaluate patient's clinical condition at discharge and address if the herbal or natural product(s) should be resumed at that time.   

## 2017-05-31 NOTE — ED Notes (Signed)
MD at bedside. 

## 2017-05-31 NOTE — ED Triage Notes (Signed)
Patient sent to ER from [facility] due to large left pleural effusions confirmed by xray today. Pt notes she awoke from sleep at 1 am today due to sharp left sided CP. Pt noted shob and CP with deep inspiration. EKG and xray completed in office showing NSR and large left pleural effusion. Pt skin warm and dry.

## 2017-05-31 NOTE — ED Provider Notes (Signed)
[facility] EMERGENCY DEPARTMENT Provider Note   CSN: [id number] Arrival date & time: 05/31/17  1742     History   Chief Complaint Chief Complaint  Patient presents with  . Shortness of Breath    HPI Chelsea Taylor is a 76 y.o. female.  Patient with history of lower extremity edema, no anticoagulation, no history of heart failure, liver problems or kidney problems -- presents with left-sided chest pain with shortness of breath occurring acutely starting today. Patient had a milder episode which resolved spontaneously about a week ago. Patient went to her doctor who performed a chest x-ray showing a left sided effusion. Patient denies recent fevers or cough. No URI symptoms. She states that she [place] to [place] and exercised normally without any decreased exercise tolerance. No nausea, vomiting, or diarrhea. No urinary symptoms. She has baseline lower extremity swelling that is not worse than normal. No redness or warmth of the lower extremities. No recent travels. The onset of this condition was acute. The course is constant. Aggravating factors: none. Alleviating factors: none.        Past Medical History:  Diagnosis Date  . Collagenous colitis   . Degenerative arthritis    Knees  . Dyslipidemia   . Fibromyalgia   . Hypertension   . Hypothyroid   . Restless legs syndrome (RLS) 02/28/2013  . Sleep apnea     Patient Active Problem List   Diagnosis Date Noted  . Acute respiratory failure ([place]) 05/31/2017  . Varicose veins of lower extremities with complications 97/98/9211  . Edema 06/03/2014  . Restless legs syndrome (RLS) 02/28/2013    Past Surgical History:  Procedure Laterality Date  . APPENDECTOMY    . BACK SURGERY  2012  . BREAST BIOPSY Left    Fibroadenoma  . CATARACT EXTRACTION    . DILATION AND CURETTAGE OF UTERUS    . FOOT SURGERY Bilateral    multiple  . [place]  2014  . [place]  2007  . HYSTEROSCOPY      . TONSILLECTOMY      OB History    No data available       Home Medications    Prior to Admission medications   Medication Sig Start Date End Date Taking? Authorizing Provider  Ascorbic Acid (VITAMIN C) 500 MG CHEW Chew 500 mg by mouth daily.    Yes [provider]  aspirin 81 MG tablet Take 81 mg by mouth at bedtime.    Yes [provider]  bisoprolol (ZEBETA) 5 MG tablet Take 5 mg by mouth daily. 02/03/13  Yes [provider]  calcium gluconate 500 MG tablet Take 500 mg by mouth 2 (two) times daily.    Yes [provider]  Cholecalciferol (VITAMIN D) 2000 units CAPS Take 2,000 Units by mouth daily.    Yes [provider]  CoenzymeQ10-Isoleucine-Glycine (CO Q-10) 100-50-25 MG TB24 Take 100 mg by mouth daily.   Yes [provider]  fish oil-omega-3 fatty acids 1000 MG capsule Take 2 g by mouth 2 (two) times daily.    Yes [provider]  folic acid (FOLVITE) 941 MCG tablet Take 800 mg by mouth at bedtime.    Yes [provider]  Glucosamine HCl 1500 MG TABS Take 1,500 mg by mouth 2 (two) times daily.    Yes [provider]  Lactobacillus (BIOTINEX PO) Take 1 tablet by mouth daily.   Yes [provider]  losartan (  COZAAR) 100 MG tablet Take 100 mg by mouth at bedtime.  02/01/13  Yes [provider]  magnesium oxide (MAG-OX) 400 MG tablet Take 400 mg by mouth daily.   Yes [provider]  Multiple Vitamin (MULTIVITAMIN) tablet Take 1 tablet by mouth daily.   Yes [provider]  Multiple Vitamins-Minerals (ICAPS MV PO) Take 1 tablet by mouth at bedtime.   Yes [provider]  Potassium Gluconate 595 MG CAPS Take 595 capsules by mouth daily.   Yes [provider]  Red Yeast Rice 600 MG CAPS Take 600 mg by mouth 2 (two) times daily.   Yes [provider]  rOPINIRole (REQUIP) 1 MG tablet Take 2 mg by mouth. 2 mg  tablets in afternoon and 3 mg tablets  at bedtime 02/25/13  Yes [provider]  sulfaSALAzine (AZULFIDINE) 500 MG EC tablet Take 1,500 mg by mouth 2 (two) times daily. 04/04/17  Yes [provider]  SYNTHROID 137 MCG tablet Take 137 tablets by mouth daily. 02/26/13  Yes [provider]  vitamin B-12 (CYANOCOBALAMIN) 100 MCG tablet Take 100 mcg by mouth at bedtime.   Yes [provider]    Family History Family History  Problem Relation Age of Onset  . Heart failure Mother   . Heart disease Mother   . Hyperlipidemia Mother   . Heart attack Mother   . Colon cancer Father   . Cancer Father   . Hypertension Father   . Heart attack Brother   . Heart disease Brother   . Diabetes Daughter     Social History Social History  Substance Use Topics  . Smoking status: Never Smoker  . Smokeless tobacco: Never Used  . Alcohol use Yes     Comment: Consumes alcohol twice per year     Allergies   Adhesive [tape]; Codeine; Neosporin [neomycin-bacitracin zn-polymyx]; and Penicillins   Review of Systems Review of Systems  Constitutional: Negative for fever.  HENT: Negative for rhinorrhea and sore throat.   Eyes: Negative for redness.  Respiratory: Positive for cough and shortness of breath.   Cardiovascular: Positive for chest pain.  Gastrointestinal: Negative for abdominal pain, diarrhea, nausea and vomiting.  Genitourinary: Negative for dysuria.  Musculoskeletal: Negative for myalgias.  Skin: Negative for rash.  Neurological: Negative for headaches.     Physical Exam Updated Vital Signs BP 102/78   Pulse 88   Temp 97.9 F (36.6 C) (Oral)   Resp 16   Ht 5\' 6"  (1.676 m)   Wt 78.5 kg (173 lb)   SpO2 96%   BMI 27.92 kg/m   Physical Exam  Constitutional: She appears well-developed and well-nourished.  HENT:  Head: Normocephalic and atraumatic.  Mouth/Throat: Oropharynx is clear and moist.  Eyes: Conjunctivae are normal. Right eye exhibits no discharge. Left eye exhibits no  discharge.  Neck: Normal range of motion. Neck supple.  Cardiovascular: Normal rate, regular rhythm and normal heart sounds.   Pulmonary/Chest: Effort normal. No accessory muscle usage. No tachypnea. No respiratory distress. She has decreased breath sounds in the left middle field and the left lower field. She has no wheezes. She has no rales.  Abdominal: Soft. There is no tenderness. There is no rebound and no guarding.  Neurological: She is alert.  Skin: Skin is warm and dry.  Psychiatric: She has a normal mood and affect.  Nursing note and vitals reviewed.    ED Treatments / Results  Labs (all labs ordered are listed, but only  abnormal results are displayed) Labs Reviewed  BASIC METABOLIC PANEL - Abnormal; Notable for the following:       Result Value   Sodium 132 (*)    Chloride 98 (*)    Glucose, Bld 158 (*)    GFR calc non Af Amer 60 (*)    All other components within normal limits  CBC - Abnormal; Notable for the following:    WBC 16.6 (*)    RBC 3.79 (*)    HCT 35.8 (*)    All other components within normal limits  BRAIN NATRIURETIC PEPTIDE - Abnormal; Notable for the following:    B Natriuretic Peptide 134.3 (*)    All other components within normal limits  I-STAT CHEM 8, ED - Abnormal; Notable for the following:    Sodium 134 (*)    Chloride 96 (*)    Glucose, Bld 155 (*)    All other components within normal limits  I-STAT TROPONIN, ED    EKG  EKG Interpretation  Date/Time:  Thursday May 31 2017 17:54:43 EDT Ventricular Rate:  90 PR Interval:  148 QRS Duration: 88 QT Interval:  346 QTC Calculation: 423 R Axis:   81 Text Interpretation:  Normal sinus rhythm Anterior infarct , age undetermined Abnormal ECG No significant change since last tracing Confirmed by Theotis Burrow [phone]) on 05/31/2017 8:20:17 PM       Radiology Ct Angio Chest Pe W And/or Wo Contrast  Result Date: 05/31/2017 CLINICAL DATA:  Left chest pain. EXAM: CT ANGIOGRAPHY CHEST  WITH CONTRAST TECHNIQUE: Multidetector CT imaging of the chest was performed using the standard protocol during bolus administration of intravenous contrast. Multiplanar CT image reconstructions and MIPs were obtained to evaluate the vascular anatomy. CONTRAST:  60 cc Isovue 370 COMPARISON:  Chest radiographs obtained earlier today. FINDINGS: Cardiovascular: Normally opacified pulmonary arteries with no pulmonary arterial filling defects. Borderline enlarged heart. Pericardial effusion with a maximum thickness of 1.5 cm. Atheromatous arterial calcifications, including the coronary arteries and thoracic aorta. Mediastinum/Nodes: No enlarged lymph nodes. Lungs/Pleura: Small to moderate-sized left pleural effusion. Mild left lower lobe atelectasis and minimal right lower lobe atelectasis. Mild peripheral bilateral lower lobe bullous changes. Upper Abdomen: Multiple liver cysts. Musculoskeletal: Thoracic spine degenerative changes. Review of the MIP images confirms the above findings. IMPRESSION: 1. No pulmonary emboli. 2. Small to moderate-sized left pleural effusion. 3. Small to moderate-sized pericardial effusion. 4. Mild left lower lobe atelectasis and minimal right lower lobe atelectasis. 5. Mild paraseptal emphysema. 6. Calcific coronary artery and aortic atherosclerosis. Aortic Atherosclerosis (ICD10-I70.0) and Emphysema (ICD10-J43.9). Electronically Signed   By: Claudie Revering M.D.   On: 05/31/2017 20:44    Procedures Procedures (including critical care time)  Medications Ordered in ED Medications  morphine 4 MG/ML injection 2 mg (not administered)  ondansetron (ZOFRAN) injection 4 mg (not administered)  rOPINIRole (REQUIP) tablet 3 mg (not administered)  iopamidol (ISOVUE-370) 76 % injection (100 mLs  Contrast Given 05/31/17 1959)     Initial Impression / Assessment and Plan / ED Course  I have reviewed the triage vital signs and the nursing notes.  Pertinent labs & imaging results that were  available during my care of the patient were reviewed by me and considered in my medical decision making (see chart for details).     Patient seen and examined. Work-up initiated. Medications ordered.   Vital signs reviewed and are as follows: BP 102/78   Pulse 88   Temp 97.9 F (36.6 C) (Oral)  Resp 16   Ht 5\' 6"  (1.676 m)   Wt 78.5 kg (173 lb)   SpO2 96%   BMI 27.92 kg/m   8:52 PM chest CT reviewed by myself. Patient will need admission for further evaluation and workup of her pericardial and pleural effusion. Updated patient and husband at bedside.  9:28 PM Spoke with Dr. Hal Hope who will admit. Pt stable.   BP (!) 128/113   Pulse 87   Temp 97.9 F (36.6 C) (Oral)   Resp (!) 32   Ht 5\' 6"  (1.676 m)   Wt 78.5 kg (173 lb)   SpO2 100%   BMI 27.92 kg/m   Final Clinical Impressions(s) / ED Diagnoses   Final diagnoses:  Pleural effusion on left  Pericardial effusion  Shortness of breath   Admit.   New Prescriptions New Prescriptions   No medications on file     Carlisle Cater, Hershal Coria 05/31/17 2129    Little, Wenda Overland, MD 06/12/17 1500

## 2017-05-31 NOTE — H&P (Addendum)
History and Physical    LUV MISH GGY:[id number] DOB: 1941-03-01 DOA: 05/31/2017  PCP: Leighton Ruff, MD  Patient coming from: Home.  Chief Complaint: Chest pain and difficulty breathing.  HPI: Chelsea Taylor is a 76 y.o. female with history of hypertension, hypothyroidism and microscopic colitis presents to the ER because of increasing difficulty breathing with chest pain.  Patient states over the last few weeks patient has been having these symptoms that patient has retrosternal chest pain nonradiating present even at rest last for a few hours and resolved without any intervention.  Patient denies any fever chills productive cough or any recent upper respiratory tract infection or recent travel or sick contacts.  Patient's symptoms got worse today with pain becoming more persistent and more severe.  Shortness of breath on deep breathing.  Patient decided to come to the ER.  ED Course: In the ER patient had a CT angiogram of the chest which was negative for PE but did show moderate size of pleural and pericardial effusion.  Patient was afebrile.  Troponins were negative.  EKG was showing normal sinus rhythm.  Patient admitted for further management.  On exam patient is not in distress but complains of pain.  Morphine did relieve some pain but has come back again.  Review of Systems: As per HPI, rest all negative.   Past Medical History:  Diagnosis Date  . Collagenous colitis   . Degenerative arthritis    Knees  . Dyslipidemia   . Fibromyalgia   . Hypertension   . Hypothyroid   . Restless legs syndrome (RLS) 02/28/2013  . Sleep apnea     Past Surgical History:  Procedure Laterality Date  . APPENDECTOMY    . BACK SURGERY  2012  . BREAST BIOPSY Left    Fibroadenoma  . CATARACT EXTRACTION    . DILATION AND CURETTAGE OF UTERUS    . FOOT SURGERY Bilateral    multiple  . [place]  2014  . [place]  2007  . HYSTEROSCOPY    . TONSILLECTOMY       reports that she has never smoked. She has never used smokeless tobacco. She reports that she drinks alcohol. She reports that she does not use drugs.  Allergies  Allergen Reactions  . Adhesive [Tape]   . Codeine   . Neosporin [Neomycin-Bacitracin Zn-Polymyx]   . Penicillins Rash    Has patient had a PCN reaction causing immediate rash, facial/tongue/throat swelling, SOB or lightheadedness with hypotension: No Has patient had a PCN reaction causing severe rash involving mucus membranes or skin necrosis: No Has patient had a PCN reaction that required hospitalization: No Has patient had a PCN reaction occurring within the last 10 years: No If all of the above answers are "NO", then may proceed with Cephalosporin use.    Family History  Problem Relation Age of Onset  . Heart failure Mother   . Heart disease Mother   . Hyperlipidemia Mother   . Heart attack Mother   . Colon cancer Father   . Cancer Father   . Hypertension Father   . Heart attack Brother   . Heart disease Brother   . Diabetes Daughter     Prior to Admission medications   Medication Sig Start Date End Date Taking? Authorizing Provider  Ascorbic Acid (VITAMIN C) 500 MG CHEW Chew 500 mg by mouth daily.    Yes [provider]  aspirin 81 MG tablet Take 81 mg by  mouth at bedtime.    Yes [provider]  bisoprolol (ZEBETA) 5 MG tablet Take 5 mg by mouth daily. 02/03/13  Yes [provider]  calcium gluconate 500 MG tablet Take 500 mg by mouth 2 (two) times daily.    Yes [provider]  Cholecalciferol (VITAMIN D) 2000 units CAPS Take 2,000 Units by mouth daily.    Yes [provider]  CoenzymeQ10-Isoleucine-Glycine (CO Q-10) 100-50-25 MG TB24 Take 100 mg by mouth daily.   Yes [provider]  fish oil-omega-3 fatty acids 1000 MG capsule Take 2 g by mouth 2 (two) times daily.    Yes [provider]  folic acid (FOLVITE) 627 MCG tablet Take 800 mg by mouth at  bedtime.    Yes [provider]  Glucosamine HCl 1500 MG TABS Take 1,500 mg by mouth 2 (two) times daily.    Yes [provider]  Lactobacillus (BIOTINEX PO) Take 1 tablet by mouth daily.   Yes [provider]  losartan (COZAAR) 100 MG tablet Take 100 mg by mouth at bedtime.  02/01/13  Yes [provider]  magnesium oxide (MAG-OX) 400 MG tablet Take 400 mg by mouth daily.   Yes [provider]  Multiple Vitamin (MULTIVITAMIN) tablet Take 1 tablet by mouth daily.   Yes [provider]  Multiple Vitamins-Minerals (ICAPS MV PO) Take 1 tablet by mouth at bedtime.   Yes [provider]  Potassium Gluconate 595 MG CAPS Take 595 capsules by mouth daily.   Yes [provider]  Red Yeast Rice 600 MG CAPS Take 600 mg by mouth 2 (two) times daily.   Yes [provider]  rOPINIRole (REQUIP) 1 MG tablet Take 2 mg by mouth. 2 mg  tablets in afternoon and 3 mg tablets at bedtime 02/25/13  Yes [provider]  sulfaSALAzine (AZULFIDINE) 500 MG EC tablet Take 1,500 mg by mouth 2 (two) times daily. 04/04/17  Yes [provider]  SYNTHROID 137 MCG tablet Take 137 tablets by mouth daily. 02/26/13  Yes [provider]  vitamin B-12 (CYANOCOBALAMIN) 100 MCG tablet Take 100 mcg by mouth at bedtime.   Yes [provider]    Physical Exam: Vitals:   05/31/17 2030 05/31/17 2045 05/31/17 2130 05/31/17 2145  BP: (!) 131/53 (!) 128/113 (!) 115/51 (!) 115/49  Pulse: 89 87 88 86  Resp: 19 (!) 32 20 17  Temp:      TempSrc:      SpO2: 100% 100% 95% 97%  Weight:      Height:          Constitutional: Moderately built and nourished. Vitals:   05/31/17 2030 05/31/17 2045 05/31/17 2130 05/31/17 2145  BP: (!) 131/53 (!) 128/113 (!) 115/51 (!) 115/49  Pulse: 89 87 88 86  Resp: 19 (!) 32 20 17  Temp:      TempSrc:      SpO2: 100% 100% 95% 97%  Weight:      Height:       Eyes: Anicteric no pallor. ENMT:  No discharge from the ears eyes nose or mouth. Neck: No JVD appreciated no mass felt. Respiratory: No rhonchi or crepitations. Cardiovascular: S1-S2 heard no murmurs appreciated no pericardial rub appreciated. Abdomen: Soft nontender bowel sounds present. Musculoskeletal: Mild edema. Skin: No rash. Neurologic: Alert awake oriented to time place and person.  Moves all extremities. Psychiatric: Appears normal.  Normal affect.   Labs on Admission: I have personally reviewed following labs  and imaging studies  CBC:  Recent Labs Lab 05/31/17 1758 05/31/17 1827  WBC 16.6*  --   HGB 12.1 12.6  HCT 35.8* 37.0  MCV 94.5  --   PLT 367  --    Basic Metabolic Panel:  Recent Labs Lab 05/31/17 1758 05/31/17 1827  NA 132* 134*  K 3.9 3.9  CL 98* 96*  CO2 26  --   GLUCOSE 158* 155*  BUN 16 18  CREATININE 0.90 0.90  CALCIUM 9.0  --    GFR: Estimated Creatinine Clearance: 54.5 mL/min (by C-G formula based on SCr of 0.9 mg/dL). Liver Function Tests: No results for input(s): AST, ALT, ALKPHOS, BILITOT, PROT, ALBUMIN in the last 168 hours. No results for input(s): LIPASE, AMYLASE in the last 168 hours. No results for input(s): AMMONIA in the last 168 hours. Coagulation Profile: No results for input(s): INR, PROTIME in the last 168 hours. Cardiac Enzymes: No results for input(s): CKTOTAL, CKMB, CKMBINDEX, TROPONINI in the last 168 hours. BNP (last 3 results) No results for input(s): PROBNP in the last 8760 hours. HbA1C: No results for input(s): HGBA1C in the last 72 hours. CBG: No results for input(s): GLUCAP in the last 168 hours. Lipid Profile: No results for input(s): CHOL, HDL, LDLCALC, TRIG, CHOLHDL, LDLDIRECT in the last 72 hours. Thyroid Function Tests: No results for input(s): TSH, T4TOTAL, FREET4, T3FREE, THYROIDAB in the last 72 hours. Anemia Panel: No results for input(s): VITAMINB12, FOLATE, FERRITIN, TIBC, IRON, RETICCTPCT in the last 72 hours. Urine analysis: No  results found for: COLORURINE, APPEARANCEUR, LABSPEC, PHURINE, GLUCOSEU, HGBUR, BILIRUBINUR, KETONESUR, PROTEINUR, UROBILINOGEN, NITRITE, LEUKOCYTESUR Sepsis Labs: @LABRCNTIP (procalcitonin:4,lacticidven:4) )No results found for this or any previous visit (from the past 240 hour(s)).   Radiological Exams on Admission: Ct Angio Chest Pe W And/or Wo Contrast  Result Date: 05/31/2017 CLINICAL DATA:  Left chest pain. EXAM: CT ANGIOGRAPHY CHEST WITH CONTRAST TECHNIQUE: Multidetector CT imaging of the chest was performed using the standard protocol during bolus administration of intravenous contrast. Multiplanar CT image reconstructions and MIPs were obtained to evaluate the vascular anatomy. CONTRAST:  60 cc Isovue 370 COMPARISON:  Chest radiographs obtained earlier today. FINDINGS: Cardiovascular: Normally opacified pulmonary arteries with no pulmonary arterial filling defects. Borderline enlarged heart. Pericardial effusion with a maximum thickness of 1.5 cm. Atheromatous arterial calcifications, including the coronary arteries and thoracic aorta. Mediastinum/Nodes: No enlarged lymph nodes. Lungs/Pleura: Small to moderate-sized left pleural effusion. Mild left lower lobe atelectasis and minimal right lower lobe atelectasis. Mild peripheral bilateral lower lobe bullous changes. Upper Abdomen: Multiple liver cysts. Musculoskeletal: Thoracic spine degenerative changes. Review of the MIP images confirms the above findings. IMPRESSION: 1. No pulmonary emboli. 2. Small to moderate-sized left pleural effusion. 3. Small to moderate-sized pericardial effusion. 4. Mild left lower lobe atelectasis and minimal right lower lobe atelectasis. 5. Mild paraseptal emphysema. 6. Calcific coronary artery and aortic atherosclerosis. Aortic Atherosclerosis (ICD10-I70.0) and Emphysema (ICD10-J43.9). Electronically Signed   By: Claudie Revering M.D.   On: 05/31/2017 20:44    EKG: Independently reviewed.  Normal sinus  rhythm.  Assessment/Plan Active Problems:   Acute respiratory failure (HCC)   Essential hypertension   Hypothyroidism    1. Pleuropericardial effusion with chest pain and shortness of breath -cause not clear.  At this time we will check for sed rate, ANA, CRP, HIV status.  Patient at this time does not look like to be in cardiac tamponade.  Will check 2D echo may need thoracentesis if no clear cause.  I  have consulted cardiology.  Will give 1 dose of Toradol for pain.  If no improvement will keep on oxycodone. 2. Hypertension -blood pressure is normal at the time of my exam.  If there is any drop we will hold off antihypertensives. 3. Hypothyroidism on Synthroid.  Check TSH. 4. History of microscopic colitis on mesalamine.  I have reviewed patient's old charts and labs.  Addendum -patient's blood pressures in the low normal at this time.  Will hold antihypertensives.  Cardiology has been consulted.   DVT prophylaxis: SCDs in anticipation of procedure. Code Status: Full code. Family Communication: Discussed with patient. Disposition Plan: Home. Consults called: Cardiology. Admission status: Observation.   Rise Patience MD [facility] Pager [phone].  If 7PM-7AM, please contact night-coverage [email or web address] Password [facility]  05/31/2017, 10:34 PM

## 2017-06-01 ENCOUNTER — Observation Stay (HOSPITAL_BASED_OUTPATIENT_CLINIC_OR_DEPARTMENT_OTHER): Payer: Medicare Other

## 2017-06-01 ENCOUNTER — Encounter (HOSPITAL_COMMUNITY): Payer: Self-pay | Admitting: Cardiology

## 2017-06-01 DIAGNOSIS — K52839 Microscopic colitis, unspecified: Secondary | ICD-10-CM | POA: Diagnosis present

## 2017-06-01 DIAGNOSIS — R079 Chest pain, unspecified: Secondary | ICD-10-CM | POA: Diagnosis not present

## 2017-06-01 DIAGNOSIS — I313 Pericardial effusion (noninflammatory): Secondary | ICD-10-CM

## 2017-06-01 DIAGNOSIS — I517 Cardiomegaly: Secondary | ICD-10-CM | POA: Diagnosis not present

## 2017-06-01 DIAGNOSIS — J9601 Acute respiratory failure with hypoxia: Secondary | ICD-10-CM | POA: Diagnosis not present

## 2017-06-01 LAB — CBC
HEMATOCRIT: 33.1 % — AB (ref 36.0–46.0)
HEMOGLOBIN: 10.9 g/dL — AB (ref 12.0–15.0)
MCH: 31.3 pg (ref 26.0–34.0)
MCHC: 32.9 g/dL (ref 30.0–36.0)
MCV: 95.1 fL (ref 78.0–100.0)
Platelets: 293 10*3/uL (ref 150–400)
RBC: 3.48 MIL/uL — ABNORMAL LOW (ref 3.87–5.11)
RDW: 12.7 % (ref 11.5–15.5)
WBC: 12.7 10*3/uL — ABNORMAL HIGH (ref 4.0–10.5)

## 2017-06-01 LAB — SEDIMENTATION RATE: Sed Rate: 81 mm/hr — ABNORMAL HIGH (ref 0–22)

## 2017-06-01 LAB — BASIC METABOLIC PANEL
ANION GAP: 10 (ref 5–15)
BUN: 18 mg/dL (ref 6–20)
CHLORIDE: 99 mmol/L — AB (ref 101–111)
CO2: 24 mmol/L (ref 22–32)
Calcium: 8.8 mg/dL — ABNORMAL LOW (ref 8.9–10.3)
Creatinine, Ser: 1 mg/dL (ref 0.44–1.00)
GFR calc Af Amer: >60 mL/min (ref >60)
GFR calc non Af Amer: 53 mL/min — ABNORMAL LOW (ref >60)
Glucose, Bld: 167 mg/dL — ABNORMAL HIGH (ref 65–99)
POTASSIUM: 4 mmol/L (ref 3.5–5.1)
Sodium: 133 mmol/L — ABNORMAL LOW (ref 135–145)

## 2017-06-01 LAB — C-REACTIVE PROTEIN: CRP: 17.9 mg/dL — AB (ref <1.0)

## 2017-06-01 LAB — ECHOCARDIOGRAM COMPLETE
Height: 68 in
Weight: 2803.2 oz

## 2017-06-01 LAB — TROPONIN I
Troponin I: <0.03 ng/mL (ref <0.03)
Troponin I: <0.03 ng/mL (ref <0.03)

## 2017-06-01 MED ORDER — ROPINIROLE HCL 1 MG PO TABS
3.0000 mg | ORAL_TABLET | Freq: Every day | ORAL | Status: DC
  Administered 2017-06-01 – 2017-06-02 (×2): 3 mg via ORAL
  Filled 2017-06-01 (×2): qty 3

## 2017-06-01 MED ORDER — LEVOTHYROXINE SODIUM 137 MCG PO TABS
137.0000 ug | ORAL_TABLET | Freq: Every day | ORAL | Status: DC
  Administered 2017-06-01 – 2017-06-03 (×3): 137 ug via ORAL
  Filled 2017-06-01 (×3): qty 1

## 2017-06-01 MED ORDER — COLCHICINE 0.6 MG PO TABS
0.6000 mg | ORAL_TABLET | Freq: Two times a day (BID) | ORAL | Status: DC
  Administered 2017-06-01 – 2017-06-03 (×4): 0.6 mg via ORAL
  Filled 2017-06-01 (×4): qty 1

## 2017-06-01 MED ORDER — ROPINIROLE HCL 1 MG PO TABS
2.0000 mg | ORAL_TABLET | Freq: Every day | ORAL | Status: DC
  Administered 2017-06-01 – 2017-06-03 (×3): 2 mg via ORAL
  Filled 2017-06-01 (×3): qty 2

## 2017-06-01 MED ORDER — PANTOPRAZOLE SODIUM 40 MG PO TBEC
40.0000 mg | DELAYED_RELEASE_TABLET | Freq: Every day | ORAL | Status: DC
  Administered 2017-06-02 – 2017-06-03 (×2): 40 mg via ORAL
  Filled 2017-06-01 (×2): qty 1

## 2017-06-01 MED ORDER — OXYCODONE HCL 5 MG PO TABS
5.0000 mg | ORAL_TABLET | Freq: Four times a day (QID) | ORAL | Status: DC | PRN
  Administered 2017-06-01 – 2017-06-03 (×7): 5 mg via ORAL
  Filled 2017-06-01 (×7): qty 1

## 2017-06-01 MED ORDER — IBUPROFEN 400 MG PO TABS
400.0000 mg | ORAL_TABLET | Freq: Four times a day (QID) | ORAL | Status: DC
  Administered 2017-06-01 – 2017-06-03 (×8): 400 mg via ORAL
  Filled 2017-06-01 (×8): qty 1

## 2017-06-01 NOTE — Progress Notes (Signed)
Pt admitted to [facility] from ER. On arrival pt is alert and oriented x4. Vss. Pt placed on tele, pt NSR. Pt reporting 6/10 presure throughout her chest and neck. MD paged and made aware. EKG obtained, no changes noted. MD to pt's bedside. toradol 15mg  iv x1 given, pt now resting in bed. Pt oriented to unit, equipment, and unit rules. White identification bracelet in place. Call light within reach. WCTM

## 2017-06-01 NOTE — Progress Notes (Signed)
  Echocardiogram 2D Echocardiogram has been performed.  Jennette Dubin 06/01/2017, 2:34 PM

## 2017-06-01 NOTE — Progress Notes (Signed)
[facility] Progress Note  Patient: Chelsea Taylor STM:[id number]   PCP: Leighton Ruff, MD DOB: 1940/12/05   DOA: 05/31/2017   DOS: 06/01/2017   Date of Service: the patient was seen and examined on 06/01/2017  Subjective: Feeling better, no chest pain but have some discomfort.  No nausea no vomiting.  Has difficulty breathing.  Brief hospital course: Pt. with PMH of HTN, HLD,COLITIS; admitted on 05/31/2017, presented with complaint of , was found to have pericarditis. Currently further plan is currnet care.  Assessment and Plan: 1. Pleuropericardial effusion with chest pain and shortness of breath Pericarditis  -cause not clear.  Elevated sed rate, CRP, HIV status. ANA pending Patient at this time does not look like to be in cardiac tamponade. may need thoracentesis if no clear cause.  2. Hypertension -blood pressure is normal at the time of my exam.  If there is any drop we will hold off antihypertensives.  3. Hypothyroidism on Synthroid.  normal TSH.  4. History of microscopic colitis on mesalamine.  Diet: cardiac diet DVT Prophylaxis: subcutaneous Heparin  Advance goals of care discussion: full code  Family Communication: family was present at bedside, at the time of interview. The pt provided permission to discuss medical plan with the family. Opportunity was given to ask question and all questions were answered satisfactorily.   Disposition:  Discharge to home.  Consultants: cardiology Procedures: Echocardiogram   Antibiotics: Anti-infectives    None       Objective: Physical Exam: Vitals:   06/01/17 1127 06/01/17 1232 06/01/17 1438 06/01/17 1550  BP: (!) 118/43 (!) 140/54 (!) 126/42 (!) 121/41  Pulse: 82  92 95  Resp: 16  18 18   Temp: 98.8 F (37.1 C)   98.9 F (37.2 C)  TempSrc: Oral   Oral  SpO2:    93%  Weight:      Height:       No intake or output data in the 24 hours ending 06/01/17 1912 Filed Weights   05/31/17 1749 05/31/17 2259  06/01/17 0500  Weight: 78.5 kg (173 lb) 79.5 kg (175 lb 3.2 oz) 79.5 kg (175 lb 3.2 oz)   General: Alert, Awake and Oriented to Time, Place and Person. Appear in mild distress, affect appropriate Eyes: PERRL, Conjunctiva normal ENT: Oral Mucosa clear moist. Neck: no JVD, no Abnormal Mass Or lumps Cardiovascular: S1 and S2 Present, no Murmur, Peripheral Pulses Present Respiratory: normal respiratory effort, Bilateral Air entry equal and Decreased, no use of accessory muscle, Clear to Auscultation, no Crackles, n wheezes Abdomen: Bowel Sound present, Soft and no tenderness, no hernia Skin: no redness, no Rash, no induration Extremities: bilateral Pedal edema, no calf tenderness Neurologic: Grossly no focal neuro deficit. Bilaterally Equal motor strength  Data Reviewed: CBC:  Recent Labs Lab 05/31/17 1758 05/31/17 1827 06/01/17 0430  WBC 16.6*  --  12.7*  HGB 12.1 12.6 10.9*  HCT 35.8* 37.0 33.1*  MCV 94.5  --  95.1  PLT 367  --  892   Basic Metabolic Panel:  Recent Labs Lab 05/31/17 1758 05/31/17 1827 06/01/17 0430  NA 132* 134* 133*  K 3.9 3.9 4.0  CL 98* 96* 99*  CO2 26  --  24  GLUCOSE 158* 155* 167*  BUN 16 18 18   CREATININE 0.90 0.90 1.00  CALCIUM 9.0  --  8.8*    Liver Function Tests: No results for input(s): AST, ALT, ALKPHOS, BILITOT, PROT, ALBUMIN in the last 168 hours. No results for input(s): LIPASE,  AMYLASE in the last 168 hours. No results for input(s): AMMONIA in the last 168 hours. Coagulation Profile: No results for input(s): INR, PROTIME in the last 168 hours. Cardiac Enzymes:  Recent Labs Lab 05/31/17 2229 06/01/17 0430 06/01/17 1121  TROPONINI <0.03 <0.03 <0.03   BNP (last 3 results) No results for input(s): PROBNP in the last 8760 hours. CBG: No results for input(s): GLUCAP in the last 168 hours. Studies: Ct Angio Chest Pe W And/or Wo Contrast  Result Date: 05/31/2017 CLINICAL DATA:  Left chest pain. EXAM: CT ANGIOGRAPHY CHEST WITH  CONTRAST TECHNIQUE: Multidetector CT imaging of the chest was performed using the standard protocol during bolus administration of intravenous contrast. Multiplanar CT image reconstructions and MIPs were obtained to evaluate the vascular anatomy. CONTRAST:  60 cc Isovue 370 COMPARISON:  Chest radiographs obtained earlier today. FINDINGS: Cardiovascular: Normally opacified pulmonary arteries with no pulmonary arterial filling defects. Borderline enlarged heart. Pericardial effusion with a maximum thickness of 1.5 cm. Atheromatous arterial calcifications, including the coronary arteries and thoracic aorta. Mediastinum/Nodes: No enlarged lymph nodes. Lungs/Pleura: Small to moderate-sized left pleural effusion. Mild left lower lobe atelectasis and minimal right lower lobe atelectasis. Mild peripheral bilateral lower lobe bullous changes. Upper Abdomen: Multiple liver cysts. Musculoskeletal: Thoracic spine degenerative changes. Review of the MIP images confirms the above findings. IMPRESSION: 1. No pulmonary emboli. 2. Small to moderate-sized left pleural effusion. 3. Small to moderate-sized pericardial effusion. 4. Mild left lower lobe atelectasis and minimal right lower lobe atelectasis. 5. Mild paraseptal emphysema. 6. Calcific coronary artery and aortic atherosclerosis. Aortic Atherosclerosis (ICD10-I70.0) and Emphysema (ICD10-J43.9). Electronically Signed   By: Claudie Revering M.D.   On: 05/31/2017 20:44    Scheduled Meds: . aspirin EC  81 mg Oral QHS  . calcium carbonate  1,250 mg Oral BID WC  . cholecalciferol  2,000 Units Oral Daily  . colchicine  0.6 mg Oral BID  . folic acid  1 mg Oral QHS  . ibuprofen  400 mg Oral QID  . levothyroxine  137 mcg Oral QAC breakfast  . magnesium oxide  400 mg Oral Daily  . multivitamin with minerals  1 tablet Oral Daily  . omega-3 acid ethyl esters  2 g Oral BID WC  . [START ON 06/02/2017] pantoprazole  40 mg Oral Q1200  . rOPINIRole  2 mg Oral Q1400  . rOPINIRole  3  mg Oral QHS  . sulfaSALAzine  1,500 mg Oral BID WC  . vitamin B-12  100 mcg Oral QHS   Continuous Infusions: PRN Meds: acetaminophen **OR** acetaminophen, ondansetron **OR** ondansetron (ZOFRAN) IV, oxyCODONE  Time spent: 35 minutes  Author: Berle Mull, MD [facility] Pager: [phone] 06/01/2017 7:12 PM  If 7PM-7AM, please contact night-coverage at [email or web address], password [facility]

## 2017-06-01 NOTE — Consult Note (Signed)
Cardiology Consultation:   Patient ID: Chelsea Taylor; [id number]; 12-19-1940   Admit date: 05/31/2017 Date of Consult: 06/01/2017  Primary Care Provider: Leighton Ruff, MD Primary Cardiologist: remote with [facility]  NEW Dr. Harrington Challenger Primary Electrophysiologist:  NA   Patient Profile:   Chelsea Taylor is a 76 y.o. female with a hx of normal stress test in 2007, HTN, hypothyroidism, microscopic colitis who is being seen today for the evaluation of increased SOB and chest pain at the request of Dr. Posey Pronto.  History of Present Illness:   Chelsea Taylor  hx of normal stress test in 2007, HTN, hypothyroidism, microscopic colitis who is being seen today for the evaluation of increased SOB and chest pain with admit last pm.  Pt has been having SOB and chest pain (chest pain is lt upper chest and mid chest)  she has had 2-3 episodes of chest pain over 2 months, though it is difficult to get good history.  They may last 2 hours but does go away with heat.  She exercises at the gym and has not had problems.  But per admit she has with exertion and rest over the last few weeks SOB.  She was seen at her PCP and CXR revealed pl effusion.  CT in ER with small to moderate Lt pl. Effusion and small to moderate sized pericardial effusion.   Calcific coronary artery and aortic atherosclerosis.   Per triage nurse she woke with sharp lt sided chest pain and was SOB and chest pain increased with deep inspiration.  Per nursing note today she had chest pressure in chest and neck resolved with toradol.   She denies any cold or fevers, no GI viruses.  She has noted when she put her CPAP on she has more trouble breathing.  She has developed Lt diaphragmatic pain as well. occ episode.  EKG   I personally reviewed SR with possible old ant MI  Though on last EKG at 23:13 pt with R wave in V3 possible lead placement TELE:  Personally reviewed SR  BNP 134 Troponin 0.00, < 0.03, <0.03, <0.03  CRP 17.9 Sed rate 81 Na  132, K+ 3.9 Cr 0.90 WBC 16.6, Hgb 12.1  Today WBC 12.7 and Hgb 10.9  Currently resting comfortable in bed.  Complains of wanting lunch and to get Echo completed and remove fluid. She has not had to sleep with head in a higher position.      Past Medical History:  Diagnosis Date  . Collagenous colitis   . Degenerative arthritis    Knees  . Dyslipidemia   . Fibromyalgia   . Hypertension   . Hypothyroid   . Restless legs syndrome (RLS) 02/28/2013  . Sleep apnea     Past Surgical History:  Procedure Laterality Date  . APPENDECTOMY    . BACK SURGERY  2012  . BREAST BIOPSY Left    Fibroadenoma  . CATARACT EXTRACTION    . DILATION AND CURETTAGE OF UTERUS    . FOOT SURGERY Bilateral    multiple  . [place]  2014  . [place]  2007  . HYSTEROSCOPY    . TONSILLECTOMY       Home Medications:  Prior to Admission medications   Medication Sig Start Date End Date Taking? Authorizing Provider  Ascorbic Acid (VITAMIN C) 500 MG CHEW Chew 500 mg by mouth daily.    Yes [provider]  aspirin 81 MG tablet Take 81 mg by mouth at  bedtime.    Yes [provider]  bisoprolol (ZEBETA) 5 MG tablet Take 5 mg by mouth daily. 02/03/13  Yes [provider]  calcium gluconate 500 MG tablet Take 500 mg by mouth 2 (two) times daily.    Yes [provider]  Cholecalciferol (VITAMIN D) 2000 units CAPS Take 2,000 Units by mouth daily.    Yes [provider]  CoenzymeQ10-Isoleucine-Glycine (CO Q-10) 100-50-25 MG TB24 Take 100 mg by mouth daily.   Yes [provider]  fish oil-omega-3 fatty acids 1000 MG capsule Take 2 g by mouth 2 (two) times daily.    Yes [provider]  folic acid (FOLVITE) 315 MCG tablet Take 800 mg by mouth at bedtime.    Yes [provider]  Glucosamine HCl 1500 MG TABS Take 1,500 mg by mouth 2 (two) times daily.    Yes [provider]  Lactobacillus (BIOTINEX PO) Take 1 tablet by mouth  daily.   Yes [provider]  losartan (COZAAR) 100 MG tablet Take 100 mg by mouth at bedtime.  02/01/13  Yes [provider]  magnesium oxide (MAG-OX) 400 MG tablet Take 400 mg by mouth daily.   Yes [provider]  Multiple Vitamin (MULTIVITAMIN) tablet Take 1 tablet by mouth daily.   Yes [provider]  Multiple Vitamins-Minerals (ICAPS MV PO) Take 1 tablet by mouth at bedtime.   Yes [provider]  Potassium Gluconate 595 MG CAPS Take 595 capsules by mouth daily.   Yes [provider]  Red Yeast Rice 600 MG CAPS Take 600 mg by mouth 2 (two) times daily.   Yes [provider]  rOPINIRole (REQUIP) 1 MG tablet Take 2 mg by mouth. 2 mg  tablets in afternoon and 3 mg tablets at bedtime 02/25/13  Yes [provider]  sulfaSALAzine (AZULFIDINE) 500 MG EC tablet Take 1,500 mg by mouth 2 (two) times daily. 04/04/17  Yes [provider]  SYNTHROID 137 MCG tablet Take 137 mcg by mouth daily.  02/26/13  Yes [provider]  vitamin B-12 (CYANOCOBALAMIN) 100 MCG tablet Take 100 mcg by mouth at bedtime.   Yes [provider]    Inpatient Medications: Scheduled Meds: . aspirin EC  81 mg Oral QHS  . calcium carbonate  1,250 mg Oral BID WC  . cholecalciferol  2,000 Units Oral Daily  . folic acid  1 mg Oral QHS  . levothyroxine  137 mcg Oral QAC breakfast  . magnesium oxide  400 mg Oral Daily  . multivitamin with minerals  1 tablet Oral Daily  . omega-3 acid ethyl esters  2 g Oral BID WC  . sulfaSALAzine  1,500 mg Oral BID WC  . vitamin B-12  100 mcg Oral QHS   Continuous Infusions:  PRN Meds: acetaminophen **OR** acetaminophen, ondansetron **OR** ondansetron (ZOFRAN) IV, oxyCODONE  Allergies:    Allergies  Allergen Reactions  . Adhesive [Tape]   . Codeine   . Neosporin [Neomycin-Bacitracin Zn-Polymyx]   . Penicillins Rash    Has patient had a PCN reaction causing immediate rash,  facial/tongue/throat swelling, SOB or lightheadedness with hypotension: No Has patient had a PCN reaction causing severe rash involving mucus membranes or skin necrosis: No Has patient had a PCN reaction that required hospitalization: No Has patient had a PCN reaction occurring within the last 10 years: No If all of the above answers are "NO", then may proceed with Cephalosporin use.    Social History:  Social History   Social History  . Marital status: Married    Spouse name: N/A  . Number of children: 1  . Years of education: 73   Occupational History  . Retired    Social History Main Topics  . Smoking status: Never Smoker  . Smokeless tobacco: Never Used  . Alcohol use Yes     Comment: Consumes alcohol twice per year  . Drug use: No  . Sexual activity: Not on file   Other Topics Concern  . Not on file   Social History Narrative  . No narrative on file    Family History:    Family History  Problem Relation Age of Onset  . Heart failure Mother   . Heart disease Mother   . Hyperlipidemia Mother   . Heart attack Mother   . Colon cancer Father   . Cancer Father   . Hypertension Father   . Heart attack Brother   . Heart disease Brother   . Diabetes Daughter      ROS:  Please see the history of present illness.  ROS  General:no colds or fevers, no weight changes Skin:no rashes or ulcers HEENT:no blurred vision, no congestion CV:see HPI PUL:see HPI GI:no diarrhea constipation or melena, no indigestion GU:no hematuria, no dysuria MS:no joint pain, no claudication Neuro:no syncope, no lightheadedness Endo:no diabetes, + thyroid disease      Physical Exam/Data:   Vitals:   05/31/17 2259 06/01/17 0500 06/01/17 0501 06/01/17 0744  BP: (!) 123/46 (!) 99/47  (!) 122/50  Pulse: 86 77  79  Resp: '18 18  18  ' Temp: 99.3 F (37.4 C) 98 F (36.7 C)  98.9 F (37.2 C)  TempSrc: Oral Oral  Oral  SpO2: 100% 91% 96% 97%  Weight: 175 lb 3.2 oz (79.5 kg) 175 lb 3.2  oz (79.5 kg)    Height: '5\' 8"'  (1.727 m)      No intake or output data in the 24 hours ending 06/01/17 1046 Filed Weights   05/31/17 1749 05/31/17 2259 06/01/17 0500  Weight: 173 lb (78.5 kg) 175 lb 3.2 oz (79.5 kg) 175 lb 3.2 oz (79.5 kg)   Body mass index is 26.64 kg/m.  General:  Well nourished, well developed, in no acute distress resting comfortable HEENT: normal Lymph: no adenopathy Neck: + JVD at 20 degrees Endocrine:  No thryomegaly Vascular: No carotid bruits;2+ pedal pulses Cardiac:  normal S1, S2; RRR; no murmur gallup rub or click Lungs:  Bilateral breath sounds to auscultation bilaterally except Lt base, no wheezing, rhonchi or rales  Abd: soft, nontender, no hepatomegaly  Ext: no pitting edema though lower ext puffy Musculoskeletal:  No deformities, BUE and BLE strength normal and equal Skin: warm and dry  Neuro:  Alert and oriented X 3, MAE follows commands, + facial symmetry, no focal abnormalities noted Psych:  Normal affect   No Pulsus parodoxes  Relevant CV Studies: Echo Pending  Laboratory Data:  Chemistry Recent Labs Lab 05/31/17 1758 05/31/17 1827 06/01/17 0430  NA 132* 134* 133*  K 3.9 3.9 4.0  CL 98* 96* 99*  CO2 26  --  24  GLUCOSE 158* 155* 167*  BUN '16 18 18  ' CREATININE 0.90 0.90 1.00  CALCIUM 9.0  --  8.8*  GFRNONAA 60*  --  53*  GFRAA >60  --  >60  ANIONGAP 8  --  10    No results for input(s): PROT, ALBUMIN, AST, ALT, ALKPHOS, BILITOT in the last  168 hours. Hematology Recent Labs Lab 05/31/17 1758 05/31/17 1827 06/01/17 0430  WBC 16.6*  --  12.7*  RBC 3.79*  --  3.48*  HGB 12.1 12.6 10.9*  HCT 35.8* 37.0 33.1*  MCV 94.5  --  95.1  MCH 31.9  --  31.3  MCHC 33.8  --  32.9  RDW 12.9  --  12.7  PLT 367  --  293   Cardiac Enzymes Recent Labs Lab 05/31/17 2229 06/01/17 0430  TROPONINI <0.03 <0.03    Recent Labs Lab 05/31/17 1804  TROPIPOC 0.00    BNP Recent Labs Lab 05/31/17 1758  BNP 134.3*    DDimer No  results for input(s): DDIMER in the last 168 hours.  Radiology/Studies:  Ct Angio Chest Pe W And/or Wo Contrast  Result Date: 05/31/2017 CLINICAL DATA:  Left chest pain. EXAM: CT ANGIOGRAPHY CHEST WITH CONTRAST TECHNIQUE: Multidetector CT imaging of the chest was performed using the standard protocol during bolus administration of intravenous contrast. Multiplanar CT image reconstructions and MIPs were obtained to evaluate the vascular anatomy. CONTRAST:  60 cc Isovue 370 COMPARISON:  Chest radiographs obtained earlier today. FINDINGS: Cardiovascular: Normally opacified pulmonary arteries with no pulmonary arterial filling defects. Borderline enlarged heart. Pericardial effusion with a maximum thickness of 1.5 cm. Atheromatous arterial calcifications, including the coronary arteries and thoracic aorta. Mediastinum/Nodes: No enlarged lymph nodes. Lungs/Pleura: Small to moderate-sized left pleural effusion. Mild left lower lobe atelectasis and minimal right lower lobe atelectasis. Mild peripheral bilateral lower lobe bullous changes. Upper Abdomen: Multiple liver cysts. Musculoskeletal: Thoracic spine degenerative changes. Review of the MIP images confirms the above findings. IMPRESSION: 1. No pulmonary emboli. 2. Small to moderate-sized left pleural effusion. 3. Small to moderate-sized pericardial effusion. 4. Mild left lower lobe atelectasis and minimal right lower lobe atelectasis. 5. Mild paraseptal emphysema. 6. Calcific coronary artery and aortic atherosclerosis. Aortic Atherosclerosis (ICD10-I70.0) and Emphysema (ICD10-J43.9). Electronically Signed   By: Claudie Revering M.D.   On: 05/31/2017 20:44    Assessment and Plan:   1. Pericardial effusion on CT of chest, moderate, waiting for Echo- I have asked them to do ASAP  Dr. Harrington Challenger to see  2. Chest pain with neg MI, stable EKG   3. Pleural effusion in present- no PE  4. HTN controlled 5. Hypothyroidism 6. Leukocytosis with elevated sed rate and CRP  also low grade temp at 99.3 at 10 PM    For questions or updates, please contact [place] Please consult [email or web address] for contact info under Cardiology/STEMI.   Signed, Cecilie Kicks, NP  06/01/2017 10:46 AM   Pt seen and examined  I agree with findings as noted by L Ingold  Pt is a 76 yo who has history of CP for several days   Denies viral infection  Went to doctor yesterday and sent to ED Pt dnies fevers, chills or cough.  Appetite good  CP is pleuritic and positional  Better with sitting   ON exam:  Neck :  JVP is normal  Lungs  Rales and sl decreased BS L base  Cardiac exam with S1, S2  No S3  No rub  No pulsus paradoxus  Abd benign  Ext without edema Labs signif for elevated ESR and CRP  TSH normal  Trop neg x3   ANA pending   Echo with mod peridcardial effuison surrounding heart   Some organization seen of this along inferior surface of RV    CT as noted above  Note also pt hs  CAD  No masses noted  A EKG with fairly diffuse ST elevation though not classic  I would recomm treating for pericarditis  Colchicine 0.6 mg bid  Cut back to daily if diarrhea  Would start NSAID with Motrin  Careful with age  WOuld start 400 q 6 hours  Add PPI for stomach protection. Will arrange for her to have f/u in clinic with limited echo for effusion.    No exercising for now.    Dorris Carnes

## 2017-06-02 ENCOUNTER — Observation Stay (HOSPITAL_COMMUNITY): Payer: Medicare Other

## 2017-06-02 DIAGNOSIS — J9601 Acute respiratory failure with hypoxia: Secondary | ICD-10-CM | POA: Diagnosis not present

## 2017-06-02 LAB — CBC WITH DIFFERENTIAL/PLATELET
BASOS ABS: 0 10*3/uL (ref 0.0–0.1)
Basophils Relative: 0 %
Eosinophils Absolute: 0.3 10*3/uL (ref 0.0–0.7)
Eosinophils Relative: 4 %
HEMATOCRIT: 30.5 % — AB (ref 36.0–46.0)
HEMOGLOBIN: 10.1 g/dL — AB (ref 12.0–15.0)
LYMPHS PCT: 26 %
Lymphs Abs: 2.1 10*3/uL (ref 0.7–4.0)
MCH: 31.5 pg (ref 26.0–34.0)
MCHC: 33.1 g/dL (ref 30.0–36.0)
MCV: 95 fL (ref 78.0–100.0)
MONO ABS: 1 10*3/uL (ref 0.1–1.0)
Monocytes Relative: 13 %
NEUTROS ABS: 4.8 10*3/uL (ref 1.7–7.7)
NEUTROS PCT: 57 %
Platelets: 270 10*3/uL (ref 150–400)
RBC: 3.21 MIL/uL — AB (ref 3.87–5.11)
RDW: 12.8 % (ref 11.5–15.5)
WBC: 8.3 10*3/uL (ref 4.0–10.5)

## 2017-06-02 LAB — COMPREHENSIVE METABOLIC PANEL
ALBUMIN: 2.7 g/dL — AB (ref 3.5–5.0)
ALT: 24 U/L (ref 14–54)
ANION GAP: 8 (ref 5–15)
AST: 26 U/L (ref 15–41)
Alkaline Phosphatase: 48 U/L (ref 38–126)
BILIRUBIN TOTAL: 0.6 mg/dL (ref 0.3–1.2)
BUN: 13 mg/dL (ref 6–20)
CO2: 25 mmol/L (ref 22–32)
Calcium: 8.7 mg/dL — ABNORMAL LOW (ref 8.9–10.3)
Chloride: 104 mmol/L (ref 101–111)
Creatinine, Ser: 0.74 mg/dL (ref 0.44–1.00)
GFR calc non Af Amer: >60 mL/min (ref >60)
GLUCOSE: 113 mg/dL — AB (ref 65–99)
POTASSIUM: 4.3 mmol/L (ref 3.5–5.1)
Sodium: 137 mmol/L (ref 135–145)
TOTAL PROTEIN: 5.6 g/dL — AB (ref 6.5–8.1)

## 2017-06-02 LAB — ANA W/REFLEX IF POSITIVE: Anti Nuclear Antibody(ANA): NEGATIVE

## 2017-06-02 LAB — MAGNESIUM: Magnesium: 1.9 mg/dL (ref 1.7–2.4)

## 2017-06-02 LAB — LACTATE DEHYDROGENASE: LDH: 150 U/L (ref 98–192)

## 2017-06-02 NOTE — Progress Notes (Signed)
[facility] Progress Note  Patient: Chelsea Taylor GGY:[id number]   PCP: Leighton Ruff, MD DOB: 1940-11-30   DOA: 05/31/2017   DOS: 06/02/2017   Date of Service: the patient was seen and examined on 06/02/2017  Subjective: Complains about right-sided pleuritic chest pain.  Her initial presentation was with left-sided pleuritic chest pain.  No nausea no vomiting.  Mild shortness of breath and chest discomfort.  Tolerating oral diet.  No diarrhea so far.  Brief hospital course: Pt. with PMH of HTN, HLD,COLITIS; admitted on 05/31/2017, presented with complaint of , was found to have pericarditis. Currently further plan is currnet care.  Assessment and Plan: 1. Pleuropericardial effusion with chest pain and shortness of breath Pericarditis  -cause not clear.  Elevated sed rate, CRP. ANA negative. Repeat chest x-ray shows mild worsening of the left-sided pleural effusion. Patient at this time does not look like to be in cardiac tamponade.  Cardiology consulted, concern for pericarditis and the patient was started on colchicine and ibuprofen as well as PPI although the patient still remains symptomatic and therefore I will proceed with left-sided thoracentesis to get more information regarding fluid analysis.  2. Hypertension -blood pressure is normal at the time of my exam.  If there is any drop we will hold off antihypertensives.  3. Hypothyroidism on Synthroid.  normal TSH.  4. History of microscopic colitis on mesalamine. With history of autoimmune disease patient is at high risk for other systemic illness which can cause both pleural effusion as well as pericardial effusion. If pleural fluid analysis is not conclusive, will require a broad range workup for autoimmune illness.  Diet: cardiac diet DVT Prophylaxis: subcutaneous Heparin  Advance goals of care discussion: full code  Family Communication: no family was present at bedside, at the time of interview.    Disposition:  Discharge to home.  Consultants: cardiology Procedures: Echocardiogram   Antibiotics: Anti-infectives    None       Objective: Physical Exam: Vitals:   06/01/17 2136 06/01/17 2301 06/02/17 0456 06/02/17 1424  BP: (!) 120/95  (!) 122/48 (!) 135/42  Pulse: 100  73 83  Resp: 18  16 20   Temp: (!) 100.8 F (38.2 C) 99.8 F (37.7 C) 98.5 F (36.9 C) 98 F (36.7 C)  TempSrc: Oral Oral Oral Oral  SpO2: 92%  92% 95%  Weight:      Height:       No intake or output data in the 24 hours ending 06/02/17 1522 Filed Weights   05/31/17 1749 05/31/17 2259 06/01/17 0500  Weight: 78.5 kg (173 lb) 79.5 kg (175 lb 3.2 oz) 79.5 kg (175 lb 3.2 oz)   General: Alert, Awake and Oriented to Time, Place and Person. Appear in mild distress, affect appropriate Eyes: PERRL, Conjunctiva normal ENT: Oral Mucosa clear moist. Neck: no JVD, no Abnormal Mass Or lumps Cardiovascular: S1 and S2 Present, no Murmur, Peripheral Pulses Present Respiratory: normal respiratory effort, Bilateral Air entry equal and Decreased, no use of accessory muscle, Clear to Auscultation, no Crackles, n wheezes Abdomen: Bowel Sound present, Soft and no tenderness, no hernia Skin: no redness, no Rash, no induration Extremities: bilateral Pedal edema, no calf tenderness Neurologic: Grossly no focal neuro deficit. Bilaterally Equal motor strength  Data Reviewed: CBC:  Recent Labs Lab 05/31/17 1758 05/31/17 1827 06/01/17 0430 06/02/17 0657  WBC 16.6*  --  12.7* 8.3  NEUTROABS  --   --   --  4.8  HGB 12.1 12.6 10.9* 10.1*  HCT 35.8* 37.0 33.1* 30.5*  MCV 94.5  --  95.1 95.0  PLT 367  --  293 161   Basic Metabolic Panel:  Recent Labs Lab 05/31/17 1758 05/31/17 1827 06/01/17 0430 06/02/17 0657  NA 132* 134* 133* 137  K 3.9 3.9 4.0 4.3  CL 98* 96* 99* 104  CO2 26  --  24 25  GLUCOSE 158* 155* 167* 113*  BUN 16 18 18 13   CREATININE 0.90 0.90 1.00 0.74  CALCIUM 9.0  --  8.8* 8.7*  MG  --   --    --  1.9    Liver Function Tests:  Recent Labs Lab 06/02/17 0657  AST 26  ALT 24  ALKPHOS 48  BILITOT 0.6  PROT 5.6*  ALBUMIN 2.7*   No results for input(s): LIPASE, AMYLASE in the last 168 hours. No results for input(s): AMMONIA in the last 168 hours. Coagulation Profile: No results for input(s): INR, PROTIME in the last 168 hours. Cardiac Enzymes:  Recent Labs Lab 05/31/17 2229 06/01/17 0430 06/01/17 1121  TROPONINI <0.03 <0.03 <0.03   BNP (last 3 results) No results for input(s): PROBNP in the last 8760 hours. CBG: No results for input(s): GLUCAP in the last 168 hours. Studies: Dg Chest 2 View  Result Date: 06/02/2017 CLINICAL DATA:  Right-sided chest pain. EXAM: CHEST  2 VIEW COMPARISON:  CT 05/31/2017 and chest radiograph 05/31/2017 FINDINGS: Persistent densities at the left chest base compatible with pleural fluid and consolidation. New blunting at the right costophrenic angle suggestive for small right pleural effusion. Upper lungs are clear without pulmonary edema. Heart size is within normal limits. Bony thorax is intact. Negative for a pneumothorax. IMPRESSION: Bilateral pleural effusions, left side greater than right. Volume loss/consolidation at the left lung base. Electronically Signed   By: Markus Daft M.D.   On: 06/02/2017 14:40    Scheduled Meds: . aspirin EC  81 mg Oral QHS  . calcium carbonate  1,250 mg Oral BID WC  . cholecalciferol  2,000 Units Oral Daily  . colchicine  0.6 mg Oral BID  . folic acid  1 mg Oral QHS  . ibuprofen  400 mg Oral QID  . levothyroxine  137 mcg Oral QAC breakfast  . magnesium oxide  400 mg Oral Daily  . multivitamin with minerals  1 tablet Oral Daily  . omega-3 acid ethyl esters  2 g Oral BID WC  . pantoprazole  40 mg Oral Q1200  . rOPINIRole  2 mg Oral Q1400  . rOPINIRole  3 mg Oral QHS  . sulfaSALAzine  1,500 mg Oral BID WC  . vitamin B-12  100 mcg Oral QHS   Continuous Infusions: PRN Meds: acetaminophen **OR**  acetaminophen, ondansetron **OR** ondansetron (ZOFRAN) IV, oxyCODONE  Time spent: 35 minutes  Author: Berle Mull, MD [facility] Pager: [phone] 06/02/2017 3:22 PM  If 7PM-7AM, please contact night-coverage at [email or web address], password [facility]

## 2017-06-02 NOTE — Progress Notes (Signed)
Subjective:  Still complains of pleuritic chest discomfort.  Many questions about why.  Pericardial effusion was not associated with tamponade and was only small to moderate.  Objective:  Vital Signs in the last 24 hours: BP (!) 122/48 (BP Location: Right Arm)   Pulse 73   Temp 98.5 F (36.9 C) (Oral)   Resp 16   Ht 5\' 8"  (1.727 m)   Wt 79.5 kg (175 lb 3.2 oz)   SpO2 92%   BMI 26.64 kg/m   Physical Exam: Talkative mildly obese elderly female in no acute distress Lungs:  Clear on the right side, reduced breath sounds at the left base Cardiac:  Regular rhythm, normal S1 and S2, no S3, no pericardial rub noted Abdomen:  Soft, nontender, no masses Extremities:  No edema present  Weight Filed Weights   05/31/17 1749 05/31/17 2259 06/01/17 0500  Weight: 78.5 kg (173 lb) 79.5 kg (175 lb 3.2 oz) 79.5 kg (175 lb 3.2 oz)    Lab Results: Basic Metabolic Panel:  Recent Labs  06/01/17 0430 06/02/17 0657  NA 133* 137  K 4.0 4.3  CL 99* 104  CO2 24 25  GLUCOSE 167* 113*  BUN 18 13  CREATININE 1.00 0.74   CBC:  Recent Labs  06/01/17 0430 06/02/17 0657  WBC 12.7* 8.3  NEUTROABS  --  4.8  HGB 10.9* 10.1*  HCT 33.1* 30.5*  MCV 95.1 95.0  PLT 293 270   Cardiac Enzymes: Troponin (Point of Care Test)  Recent Labs  05/31/17 1804  TROPIPOC 0.00   Cardiac Panel (last 3 results)  Recent Labs  05/31/17 2229 06/01/17 0430 06/01/17 1121  TROPONINI <0.03 <0.03 <0.03    Telemetry: Sinus rhythm personally reviewed  Assessment/Plan:  1.  Left pleural effusion at least moderate by my exam and on x-ray 2.  Small to moderate pericardial effusion-no evidence of tamponade  Recommendations:  Still symptomatic and difficult to assess.  There are number of reasons she could potentially have this but the effusion is at least moderate on the left with no good explanation such as recent infection.  Consider thoracentesis to obtain sample of fluid if they feel he can be done  safely in order to get a good diagnosis on the fluid.  Continue colchicine and ibuprofen.   o  W. Doristine Church  MD [facility]  06/02/2017, 1:25 PM

## 2017-06-03 ENCOUNTER — Observation Stay (HOSPITAL_COMMUNITY): Payer: Medicare Other

## 2017-06-03 DIAGNOSIS — I313 Pericardial effusion (noninflammatory): Secondary | ICD-10-CM | POA: Diagnosis not present

## 2017-06-03 LAB — ALBUMIN, PLEURAL OR PERITONEAL FLUID: ALBUMIN FL: 2 g/dL

## 2017-06-03 LAB — BODY FLUID CELL COUNT WITH DIFFERENTIAL
EOS FL: 0 %
LYMPHS FL: 8 %
MONOCYTE-MACROPHAGE-SEROUS FLUID: 24 % — AB (ref 50–90)
NEUTROPHIL FLUID: 68 % — AB (ref 0–25)
Total Nucleated Cell Count, Fluid: 2456 cu mm — ABNORMAL HIGH (ref 0–1000)

## 2017-06-03 LAB — GRAM STAIN

## 2017-06-03 LAB — LACTATE DEHYDROGENASE, PLEURAL OR PERITONEAL FLUID: LD FL: 151 U/L — AB (ref 3–23)

## 2017-06-03 LAB — PROTEIN, PLEURAL OR PERITONEAL FLUID: Total protein, fluid: 3.3 g/dL

## 2017-06-03 LAB — GLUCOSE, PLEURAL OR PERITONEAL FLUID: Glucose, Fluid: 103 mg/dL

## 2017-06-03 MED ORDER — COLCHICINE 0.6 MG PO TABS: 0.6000 mg | ORAL_TABLET | Freq: Two times a day (BID) | ORAL | 1 refills | Status: DC

## 2017-06-03 MED ORDER — OXYCODONE HCL 5 MG PO TABS: 5.0000 mg | ORAL_TABLET | Freq: Four times a day (QID) | ORAL | 0 refills | Status: DC | PRN

## 2017-06-03 MED ORDER — IBUPROFEN 400 MG PO TABS: 400.0000 mg | ORAL_TABLET | Freq: Four times a day (QID) | ORAL | 0 refills | Status: DC

## 2017-06-03 MED ORDER — LIDOCAINE HCL (PF) 1 % IJ SOLN
INTRAMUSCULAR | Status: AC
  Filled 2017-06-03: qty 30

## 2017-06-03 MED ORDER — PANTOPRAZOLE SODIUM 40 MG PO TBEC: 40.0000 mg | DELAYED_RELEASE_TABLET | Freq: Every day | ORAL | 0 refills | Status: DC

## 2017-06-03 NOTE — Progress Notes (Signed)
Subjective:  Still complains of pleuritic chest discomfort.  Had thoracentesis of greater than 400 cc of fluid earlier.  Appears to have a slight neutrophilic predominance.  Not much in the way of shortness of breath.  Objective:  Vital Signs in the last 24 hours: BP (!) 142/56 (BP Location: Left Arm)   Pulse 93   Temp 98.3 F (36.8 C) (Oral)   Resp 18   Ht 5\' 8"  (1.727 m)   Wt 81.8 kg (180 lb 4.8 oz)   SpO2 93%   BMI 27.41 kg/m   Physical Exam: Talkative mildly obese elderly female in no acute distress Lungs:  Clear on the right side, reduced breath sounds at the left base Cardiac:  Regular rhythm, normal S1 and S2, no S3, no pericardial rub noted Extremities:  No edema present  Weight Filed Weights   05/31/17 2259 06/01/17 0500 06/03/17 0531  Weight: 79.5 kg (175 lb 3.2 oz) 79.5 kg (175 lb 3.2 oz) 81.8 kg (180 lb 4.8 oz)    Lab Results: Basic Metabolic Panel:  Recent Labs  06/01/17 0430 06/02/17 0657  NA 133* 137  K 4.0 4.3  CL 99* 104  CO2 24 25  GLUCOSE 167* 113*  BUN 18 13  CREATININE 1.00 0.74   CBC:  Recent Labs  06/01/17 0430 06/02/17 0657  WBC 12.7* 8.3  NEUTROABS  --  4.8  HGB 10.9* 10.1*  HCT 33.1* 30.5*  MCV 95.1 95.0  PLT 293 270   Cardiac Enzymes: Troponin (Point of Care Test)  Recent Labs  05/31/17 1804  TROPIPOC 0.00   Cardiac Panel (last 3 results)  Recent Labs  05/31/17 2229 06/01/17 0430 06/01/17 1121  TROPONINI <0.03 <0.03 <0.03    Telemetry: Sinus rhythm personally reviewed  Assessment/Plan:  1.  Left pleural effusion at least moderate by my exam and on x-ray now with recent thoracentesis with pathology pending 2.  Small to moderate pericardial effusion-no evidence of tamponade  Recommendations:  Just had thoracentesis and I did not see a pneumothorax on the x-ray.  Okay to go home from a cardiac viewpoint if she wants to go home today and should follow-up in one to 2 weeks with Dr. Harrington Challenger with a limited echo at  that same time to evaluate the pericardial effusion further.  Kerry Hough  MD [facility] Card[facility]  06/03/2017, 11:59 AM

## 2017-06-03 NOTE — Discharge Summary (Signed)
[facility] Discharge Summary   Patient: Chelsea Taylor LPF:[id number]   PCP: Leighton Ruff, MD DOB: April 01, 1941   Date of admission: 05/31/2017   Date of discharge:  06/03/2017    Discharge Diagnoses:  Active Problems:   Acute respiratory failure (HCC)   Essential hypertension   Hypothyroidism   Microscopic colitis   Admitted From: Home Disposition: Home  Recommendations for Outpatient Follow-up:  1. Follow-up with PCP in 2 week, follow-up with cardiology as recommended with a follow-up echocardiogram. 2. Patient will need a repeat chest x-ray to evaluate for resolution of pleural effusion once pericardial effusion is resolved. 3. Follow-up on cytology as well as fluid culture.  Follow-up Information    Fay Records, MD Follow up.   Specialty:  Cardiology Why:  our office will call to arrange hospital follow-up in 1 week  Contact information: [place] [address] [phone]        Leighton Ruff, MD. Schedule an appointment as soon as possible for a visit in 1 week(s).   Specialty:  Family Medicine Contact information: [place] [address] [phone]          Diet recommendation: Cardiac diet  Activity: The patient is advised to gradually reintroduce usual activities.  Discharge Condition: good  Code Status: Full code  History of present illness: As per the H and P dictated on admission, "Chelsea Taylor is a 75 y.o. female with history of hypertension, hypothyroidism and microscopic colitis presents to the ER because of increasing difficulty breathing with chest pain.  Patient states over the last few weeks patient has been having these symptoms that patient has retrosternal chest pain nonradiating present even at rest last for a few hours and resolved without any intervention.  Patient denies any fever chills productive cough or any recent upper respiratory tract infection or recent travel or  sick contacts.  Patient's symptoms got worse today with pain becoming more persistent and more severe.  Shortness of breath on deep breathing.  Patient decided to come to the ER.  ED Course: In the ER patient had a CT angiogram of the chest which was negative for PE but did show moderate size of pleural and pericardial effusion.  Patient was afebrile.  Troponins were negative.  EKG was showing normal sinus rhythm.  Patient admitted for further management.  On exam patient is not in distress but complains of pain.  Morphine did relieve some pain but has come back again."  Hospital Course:  Summary of her active problems in the hospital is as following. 1. Pleuropericardial effusion with chest pain and shortness of breath Pericarditis Exudative pleural effusion  -cause not clear.  Elevated sed rate, CRP. ANA negative.  Cardiology consulted, concern for pericarditis and the patient was started on colchicine and ibuprofen as well as PPI  Underwent left-sided thoracentesis-exudative fluid based on light's criteria. Hemosiderin laden cells on Gram stain, no organism. With involvement of bilateral pleural space as well as pericardium more likely systemic illness. We will continue with current treatment protocol and recommend patient to follow-up with PCP in 1-2 weeks to ensure resolution of the effusion.  Otherwise the patient will require further workup. Cytology still pending. Culture from pleural fluid is also pending.  2. Hypertension -blood pressure is normal at the time of my exam. Patient did not receive any of her blood pressure medication, I would currently hold them on discharge.  To follow-up with PCP.  3. Hypothyroidism  on Synthroid. normal TSH.  4. History of microscopic colitis on mesalamine. With history of autoimmune disease patient is at high risk for other systemic illness which can cause both pleural effusion as well as pericardial effusion. If pleural fluid analysis is  not conclusive, will require a broad range workup for autoimmune illness.   All other chronic medical condition were stable during the hospitalization.  Patient was ambulatory without any assistance. On the day of the discharge the patient's vitals were stable, and no other acute medical condition were reported by patient. the patient was felt safe to be discharge at home with family.  Procedures and Results:  Echocardiogram   Left thoracentesis    Consultations:  Cardiology  DISCHARGE MEDICATION: Current Discharge Medication List    START taking these medications   Details  colchicine 0.6 MG tablet Take 1 tablet (0.6 mg total) by mouth 2 (two) times daily. Qty: 60 tablet, Refills: 1    ibuprofen (ADVIL,MOTRIN) 400 MG tablet Take 1 tablet (400 mg total) by mouth 4 (four) times daily. Qty: 30 tablet, Refills: 0    oxyCODONE (OXY IR/ROXICODONE) 5 MG immediate release tablet Take 1 tablet (5 mg total) by mouth every 6 (six) hours as needed for moderate pain or severe pain. Qty: 20 tablet, Refills: 0    pantoprazole (PROTONIX) 40 MG tablet Take 1 tablet (40 mg total) by mouth daily at 12 noon. Qty: 30 tablet, Refills: 0      CONTINUE these medications which have NOT CHANGED   Details  Ascorbic Acid (VITAMIN C) 500 MG CHEW Chew 500 mg by mouth daily.     aspirin 81 MG tablet Take 81 mg by mouth at bedtime.     calcium gluconate 500 MG tablet Take 500 mg by mouth 2 (two) times daily.     Cholecalciferol (VITAMIN D) 2000 units CAPS Take 2,000 Units by mouth daily.     CoenzymeQ10-Isoleucine-Glycine (CO Q-10) 100-50-25 MG TB24 Take 100 mg by mouth daily.    fish oil-omega-3 fatty acids 1000 MG capsule Take 2 g by mouth 2 (two) times daily.     folic acid (FOLVITE) 284 MCG tablet Take 800 mcg by mouth at bedtime.     Glucosamine HCl 1500 MG TABS Take 1,500 mg by mouth 2 (two) times daily.     Lactobacillus (BIOTINEX PO) Take 1 tablet by mouth daily.    magnesium oxide  (MAG-OX) 400 MG tablet Take 400 mg by mouth daily.    Multiple Vitamin (MULTIVITAMIN) tablet Take 1 tablet by mouth daily.    Multiple Vitamins-Minerals (ICAPS MV PO) Take 1 tablet by mouth at bedtime.    Red Yeast Rice 600 MG CAPS Take 600 mg by mouth 2 (two) times daily.    rOPINIRole (REQUIP) 1 MG tablet Take 2 mg by mouth. 2 mg  tablets in afternoon and 3 mg tablets at bedtime    sulfaSALAzine (AZULFIDINE) 500 MG EC tablet Take 1,500 mg by mouth 2 (two) times daily.    SYNTHROID 137 MCG tablet Take 137 mcg by mouth daily.     vitamin B-12 (CYANOCOBALAMIN) 100 MCG tablet Take 100 mcg by mouth at bedtime.      STOP taking these medications     bisoprolol (ZEBETA) 5 MG tablet      losartan (COZAAR) 100 MG tablet      Potassium Gluconate 595 MG CAPS        Allergies  Allergen Reactions  . Adhesive [Tape]   . Codeine   .  Neosporin [Neomycin-Bacitracin Zn-Polymyx]   . Penicillins Rash    Has patient had a PCN reaction causing immediate rash, facial/tongue/throat swelling, SOB or lightheadedness with hypotension: No Has patient had a PCN reaction causing severe rash involving mucus membranes or skin necrosis: No Has patient had a PCN reaction that required hospitalization: No Has patient had a PCN reaction occurring within the last 10 years: No If all of the above answers are "NO", then may proceed with Cephalosporin use.   Discharge Instructions    Diet - low sodium heart healthy    Complete by:  As directed    Discharge instructions    Complete by:  As directed    It is important that you read following instructions as well as go over your medication list with RN to help you understand your care after this hospitalization.  Discharge Instructions: Please follow-up with PCP in one week  Please request your primary care physician to go over all Hospital Tests and Procedure/Radiological results at the follow up,  Please get all Hospital records sent to your PCP by  signing hospital release before you go home.   Do not take more than prescribed Pain, Sleep and Anxiety Medications. You were cared for by a hospitalist during your hospital stay. If you have any questions about your discharge medications or the care you received while you were in the hospital after you are discharged, you can call the unit and ask to speak with the hospitalist on call if the hospitalist that took care of you is not available.  Once you are discharged, your primary care physician will handle any further medical issues. Please note that NO REFILLS for any discharge medications will be authorized once you are discharged, as it is imperative that you return to your primary care physician (or establish a relationship with a primary care physician if you do not have one) for your aftercare needs so that they can reassess your need for medications and monitor your lab values. You Must read complete instructions/literature along with all the possible adverse reactions/side effects for all the Medicines you take and that have been prescribed to you. Take any new Medicines after you have completely understood and accept all the possible adverse reactions/side effects. Wear Seat belts while driving. If you have smoked or chewed Tobacco in the last 2 yrs please stop smoking and/or stop any Recreational drug use.   Increase activity slowly    Complete by:  As directed      Discharge Exam: Filed Weights   05/31/17 2259 06/01/17 0500 06/03/17 0531  Weight: 79.5 kg (175 lb 3.2 oz) 79.5 kg (175 lb 3.2 oz) 81.8 kg (180 lb 4.8 oz)   Vitals:   06/03/17 0909 06/03/17 1429  BP: (!) 142/56 (!) 145/60  Pulse:  93  Resp:  18  Temp:  98.5 F (36.9 C)  SpO2:  100%   General: Appear in no distress, no Rash; Oral Mucosa moist. Cardiovascular: S1 and S2 Present, no Murmur, no JVD Respiratory: Bilateral Air entry present and Clear to Auscultation, no Crackles, no wheezes Abdomen: Bowel Sound  present, Soft and no tenderness Extremities: no Pedal edema, no calf tenderness Neurology: Grossly no focal neuro deficit.  The results of significant diagnostics from this hospitalization (including imaging, microbiology, ancillary and laboratory) are listed below for reference.    Significant Diagnostic Studies: Dg Chest 1 View  Result Date: 06/03/2017 CLINICAL DATA:  Pleural effusion status post left thoracentesis. EXAM: CHEST 1 VIEW COMPARISON:  06/02/2017 FINDINGS: The cardiomediastinal silhouette is unchanged. There is a small left pleural effusion, decreased in size following thoracentesis. Persistent left basilar airspace opacity remains. Slight blunting of the right costophrenic angle is unchanged and suggests a small pleural effusion. No pneumothorax is identified. IMPRESSION: 1. Small pleural effusions, decreased on the left following thoracentesis. No pneumothorax. 2. Persistent left basilar atelectasis or consolidation. Electronically Signed   By: Logan Bores M.D.   On: 06/03/2017 09:39   Dg Chest 2 View  Result Date: 06/02/2017 CLINICAL DATA:  Right-sided chest pain. EXAM: CHEST  2 VIEW COMPARISON:  CT 05/31/2017 and chest radiograph 05/31/2017 FINDINGS: Persistent densities at the left chest base compatible with pleural fluid and consolidation. New blunting at the right costophrenic angle suggestive for small right pleural effusion. Upper lungs are clear without pulmonary edema. Heart size is within normal limits. Bony thorax is intact. Negative for a pneumothorax. IMPRESSION: Bilateral pleural effusions, left side greater than right. Volume loss/consolidation at the left lung base. Electronically Signed   By: Markus Daft M.D.   On: 06/02/2017 14:40   Ct Angio Chest Pe W And/or Wo Contrast  Result Date: 05/31/2017 CLINICAL DATA:  Left chest pain. EXAM: CT ANGIOGRAPHY CHEST WITH CONTRAST TECHNIQUE: Multidetector CT imaging of the chest was performed using the standard protocol  during bolus administration of intravenous contrast. Multiplanar CT image reconstructions and MIPs were obtained to evaluate the vascular anatomy. CONTRAST:  60 cc Isovue 370 COMPARISON:  Chest radiographs obtained earlier today. FINDINGS: Cardiovascular: Normally opacified pulmonary arteries with no pulmonary arterial filling defects. Borderline enlarged heart. Pericardial effusion with a maximum thickness of 1.5 cm. Atheromatous arterial calcifications, including the coronary arteries and thoracic aorta. Mediastinum/Nodes: No enlarged lymph nodes. Lungs/Pleura: Small to moderate-sized left pleural effusion. Mild left lower lobe atelectasis and minimal right lower lobe atelectasis. Mild peripheral bilateral lower lobe bullous changes. Upper Abdomen: Multiple liver cysts. Musculoskeletal: Thoracic spine degenerative changes. Review of the MIP images confirms the above findings. IMPRESSION: 1. No pulmonary emboli. 2. Small to moderate-sized left pleural effusion. 3. Small to moderate-sized pericardial effusion. 4. Mild left lower lobe atelectasis and minimal right lower lobe atelectasis. 5. Mild paraseptal emphysema. 6. Calcific coronary artery and aortic atherosclerosis. Aortic Atherosclerosis (ICD10-I70.0) and Emphysema (ICD10-J43.9). Electronically Signed   By: Claudie Revering M.D.   On: 05/31/2017 20:44   US Thoracentesis Asp Pleural Space W/img Guide  Result Date: 06/03/2017 INDICATION: Symptomatic left sided pleural effusion EXAM: US THORACENTESIS ASP PLEURAL SPACE W/IMG GUIDE COMPARISON:  None. MEDICATIONS: 10 cc 1% lidocaine. COMPLICATIONS: None immediate. TECHNIQUE: Informed written consent was obtained from the patient after a discussion of the risks, benefits and alternatives to treatment. A timeout was performed prior to the initiation of the procedure. Initial ultrasound scanning demonstrates a left pleural effusion. The lower chest was prepped and draped in the usual sterile fashion. 1% lidocaine was  used for local anesthesia. Under direct ultrasound guidance, a 19 gauge, 7-cm, Yueh catheter was introduced. An ultrasound image was saved for documentation purposes. The thoracentesis was performed. The catheter was removed and a dressing was applied. The patient tolerated the procedure well without immediate post procedural complication. The patient was escorted to have an upright chest radiograph. FINDINGS: A total of approximately 480 cc of yellow fluid was removed. Requested samples were sent to the laboratory. IMPRESSION: Successful ultrasound-guided left sided thoracentesis yielding 480 cc of pleural fluid. Follow-up chest radiograph shows no pneumothorax. Read by Lavonia Drafts [facility] Electronically Signed   By:  Lucrezia Europe M.D.   On: 06/03/2017 09:34    Microbiology: Recent Results (from the past 240 hour(s))  Gram stain     Status: None   Collection Time: 06/03/17  9:23 AM  Result Value Ref Range Status   Specimen Description FLUID LEFT PLEURAL  Final   Special Requests NONE  Final   Gram Stain   Final    MODERATE WBC PRESENT, PREDOMINANTLY PMN NO ORGANISMS SEEN    Report Status 06/03/2017 FINAL  Final     Labs: CBC:  Recent Labs Lab 05/31/17 1758 05/31/17 1827 06/01/17 0430 06/02/17 0657  WBC 16.6*  --  12.7* 8.3  NEUTROABS  --   --   --  4.8  HGB 12.1 12.6 10.9* 10.1*  HCT 35.8* 37.0 33.1* 30.5*  MCV 94.5  --  95.1 95.0  PLT 367  --  293 975   Basic Metabolic Panel:  Recent Labs Lab 05/31/17 1758 05/31/17 1827 06/01/17 0430 06/02/17 0657  NA 132* 134* 133* 137  K 3.9 3.9 4.0 4.3  CL 98* 96* 99* 104  CO2 26  --  24 25  GLUCOSE 158* 155* 167* 113*  BUN 16 18 18 13   CREATININE 0.90 0.90 1.00 0.74  CALCIUM 9.0  --  8.8* 8.7*  MG  --   --   --  1.9   Liver Function Tests:  Recent Labs Lab 06/02/17 0657  AST 26  ALT 24  ALKPHOS 48  BILITOT 0.6  PROT 5.6*  ALBUMIN 2.7*   No results for input(s): LIPASE, AMYLASE in the last 168 hours. No results for  input(s): AMMONIA in the last 168 hours. Cardiac Enzymes:  Recent Labs Lab 05/31/17 2229 06/01/17 0430 06/01/17 1121  TROPONINI <0.03 <0.03 <0.03   BNP (last 3 results)  Recent Labs  05/31/17 1758  BNP 134.3*   CBG: No results for input(s): GLUCAP in the last 168 hours. Time spent: 35 minutes  Signed:  Berle Mull  [facility]  06/03/2017  , 3:39 PM

## 2017-06-03 NOTE — Progress Notes (Signed)
Patient discharge teaching given, including activity, diet, follow-up appoints, and medications. Patient verbalized understanding of all discharge instructions. IV access was d/c'd. Vitals are stable. Skin is intact except as charted in most recent assessments. Pt to be escorted out by NT, to be driven home by family. 

## 2017-06-03 NOTE — Procedures (Signed)
   US guided left thoracentesis  480 cc yellow fluid aspirated Sent for labs per MD  Tolerated well  CXR pending

## 2017-06-04 LAB — ACID FAST SMEAR (AFB): ACID FAST SMEAR - AFSCU2: NEGATIVE

## 2017-06-04 LAB — ACID FAST SMEAR (AFB, MYCOBACTERIA)

## 2017-06-04 LAB — TRIGLYCERIDES, BODY FLUIDS: TRIGLYCERIDES FL: 19 mg/dL

## 2017-06-05 LAB — PH, BODY FLUID: PH, BODY FLUID: 7.2

## 2017-06-06 ENCOUNTER — Telehealth: Payer: Self-pay | Admitting: Physician Assistant

## 2017-06-06 NOTE — Telephone Encounter (Signed)
Returned call to patient's daughter.Advised she is not on mother's hippa form.Patient was called she stated she would like to be seen this week.Stated she was in hospital recently.Stated she continues to be sob with exertion.Stated sob is no worse, just not any better.Appointment scheduled with Cecilie Kicks NP 06/08/17 at 2:00 pm.Advised to bring all medications to appointment.

## 2017-06-06 NOTE — Telephone Encounter (Signed)
New Message:      Pt was discharged on 06-04-17 from [facility]. Daughter feels pt needs to be seen this week if possible please.Her appt is not until 06-21-17.She is still having problems with her blood pressure.

## 2017-06-08 ENCOUNTER — Encounter: Payer: Self-pay | Admitting: Cardiology

## 2017-06-08 ENCOUNTER — Ambulatory Visit (INDEPENDENT_AMBULATORY_CARE_PROVIDER_SITE_OTHER): Payer: Medicare Other | Admitting: Cardiology

## 2017-06-08 ENCOUNTER — Encounter: Payer: Self-pay | Admitting: Cardiovascular Disease

## 2017-06-08 VITALS — BP 142/50 | HR 88 | Resp 16 | Ht 66.0 in | Wt 177.1 lb

## 2017-06-08 DIAGNOSIS — J90 Pleural effusion, not elsewhere classified: Secondary | ICD-10-CM

## 2017-06-08 DIAGNOSIS — I10 Essential (primary) hypertension: Secondary | ICD-10-CM | POA: Diagnosis not present

## 2017-06-08 DIAGNOSIS — E039 Hypothyroidism, unspecified: Secondary | ICD-10-CM | POA: Diagnosis not present

## 2017-06-08 DIAGNOSIS — I3139 Other pericardial effusion (noninflammatory): Secondary | ICD-10-CM

## 2017-06-08 DIAGNOSIS — I313 Pericardial effusion (noninflammatory): Secondary | ICD-10-CM

## 2017-06-08 LAB — CULTURE, BODY FLUID W GRAM STAIN -BOTTLE: Culture: NO GROWTH

## 2017-06-08 LAB — CULTURE, BODY FLUID-BOTTLE

## 2017-06-08 NOTE — Progress Notes (Signed)
Cardiology Office Note   Date:  06/10/2017   ID:  Chelsea Taylor, DOB August 30, 1940, MRN [id number]  PCP:  Leighton Ruff, MD  Cardiologist:  New Dr. Harrington Challenger     Chief Complaint  Patient presents with  . Hospitalization Follow-up    SOB      History of Present Illness: Chelsea Taylor is a 76 y.o. female who presents for post hospitalization  hx of normal stress test in 2007, HTN, hypothyroidism, microscopic colitis   Echo with mod peridcardial effuison surrounding heart   Some organization seen of this along inferior surface of RV    CT as noted above  Note also pt hs CAD  No masses noted  A EKG with fairly diffuse ST elevation though not classic  I would recomm treating for pericarditis  Colchicine 0.6 mg bid  Cut back to daily if diarrhea  Would start NSAID with Motrin  Careful with age  WOuld start 400 q 6 hours  Add PPI for stomach protection. Will arrange for her to have f/u in clinic with limited echo for effusion.    No exercising for now.    She had thoracentesis of 400cc treating pericardial effusion with colchicine and NSAIDS.  Her pathology returned with no malignant but reactive mesothelial cells, to see pulmonary on Wed. Next week.  She still has Lt diaphragmatic pain with lying flat feels better sitting up.  Overall feeling better. She is SOB with exertion.  Labs with PCP Cr. 0.89, K+ 4.5 LFTs WNL Hgb 12.1 and Hct 36.3.  CXR was done I do not have that result.   Past Medical History:  Diagnosis Date  . Collagenous colitis   . Degenerative arthritis    Knees  . Dyslipidemia   . Fibromyalgia   . Hypertension   . Hypothyroid   . Restless legs syndrome (RLS) 02/28/2013  . Sleep apnea     Past Surgical History:  Procedure Laterality Date  . APPENDECTOMY    . BACK SURGERY  2012  . BREAST BIOPSY Left    Fibroadenoma  . CATARACT EXTRACTION    . DILATION AND CURETTAGE OF UTERUS    . FOOT SURGERY Bilateral    multiple  . [place]  2014  .  [place]  2007  . HYSTEROSCOPY    . TONSILLECTOMY       Current Outpatient Medications  Medication Sig Dispense Refill  . Ascorbic Acid (VITAMIN C) 500 MG CHEW Chew 500 mg by mouth daily.     Marland Kitchen aspirin 81 MG tablet Take 81 mg by mouth at bedtime.     . calcium gluconate 500 MG tablet Take 500 mg by mouth 2 (two) times daily.     . Cholecalciferol (VITAMIN D) 2000 units CAPS Take 2,000 Units by mouth daily.     . CoenzymeQ10-Isoleucine-Glycine (CO Q-10) 100-50-25 MG TB24 Take 100 mg by mouth daily.    . colchicine 0.6 MG tablet Take 1 tablet (0.6 mg total) by mouth 2 (two) times daily. 60 tablet 1  . fish oil-omega-3 fatty acids 1000 MG capsule Take 2 g by mouth 2 (two) times daily.     . folic acid (FOLVITE) 025 MCG tablet Take 800 mcg by mouth at bedtime.     . Glucosamine HCl 1500 MG TABS Take 1,500 mg by mouth 2 (two) times daily.     Marland Kitchen ibuprofen (ADVIL,MOTRIN) 400 MG tablet Take 1 tablet (400 mg total) by mouth 4 (four) times  daily. 30 tablet 0  . losartan (COZAAR) 50 MG tablet Take 50 mg by mouth daily.    . magnesium oxide (MAG-OX) 400 MG tablet Take 400 mg by mouth daily.    . Multiple Vitamin (MULTIVITAMIN) tablet Take 1 tablet by mouth daily.    . Multiple Vitamins-Minerals (ICAPS MV PO) Take 1 tablet by mouth at bedtime.    Marland Kitchen oxyCODONE (OXY IR/ROXICODONE) 5 MG immediate release tablet Take 1 tablet (5 mg total) by mouth every 6 (six) hours as needed for moderate pain or severe pain. 20 tablet 0  . pantoprazole (PROTONIX) 40 MG tablet Take 1 tablet (40 mg total) by mouth daily at 12 noon. 30 tablet 0  . Red Yeast Rice 600 MG CAPS Take 600 mg by mouth 2 (two) times daily.    Marland Kitchen rOPINIRole (REQUIP) 1 MG tablet Take 2 mg by mouth. 2 mg  tablets in afternoon and 3 mg tablets at bedtime    . sulfaSALAzine (AZULFIDINE) 500 MG EC tablet Take 1,500 mg by mouth 2 (two) times daily.    Marland Kitchen SYNTHROID 137 MCG tablet Take 137 mcg by mouth daily.     . vitamin B-12 (CYANOCOBALAMIN) 100  MCG tablet Take 100 mcg by mouth at bedtime.     No current facility-administered medications for this visit.     Allergies:   Adhesive [tape]; Codeine; Neosporin [neomycin-bacitracin zn-polymyx]; and Penicillins    Social History:  The patient  reports that  has never smoked. she has never used smokeless tobacco. She reports that she drinks alcohol. She reports that she does not use drugs.   Family History:  The patient's family history includes Cancer in her father; Colon cancer in her father; Diabetes in her daughter; Heart attack in her brother and mother; Heart disease in her brother and mother; Heart failure in her mother; Hyperlipidemia in her mother; Hypertension in her father.    ROS:  General:no colds or fevers, + weight changes according to wts below Skin:no rashes or ulcers HEENT:no blurred vision, no congestion CV:see HPI PUL:see HPI GI:no diarrhea constipation or melena, no indigestion GU:no hematuria, no dysuria MS:no joint pain, no claudication Neuro:no syncope, no lightheadedness Endo:no diabetes, no thyroid disease  Wt Readings from Last 3 Encounters:  06/08/17 177 lb 1.9 oz (80.3 kg)  06/03/17 180 lb 4.8 oz (81.8 kg)  06/03/14 191 lb 14.4 oz (87 kg)     PHYSICAL EXAM: VS:  BP (!) 142/50   Pulse 88   Resp 16   Ht 5\' 6"  (1.676 m)   Wt 177 lb 1.9 oz (80.3 kg)   SpO2 95%   BMI 28.59 kg/m  , BMI Body mass index is 28.59 kg/m. General:Pleasant affect, NAD Skin:Warm and dry, brisk capillary refill HEENT:normocephalic, sclera clear, mucus membranes moist Neck:supple, no JVD, no bruits  Heart:S1S2 RRR without murmur, gallup, rub or click Lungs:clear without rales, rhonchi, or wheezes JAS:NKNL, non tender, + BS, do not palpate liver spleen or masses Ext:no lower ext edema, 2+ pedal pulses, 2+ radial pulses Neuro:alert and oriented X 3, MAE, follows commands, + facial symmetry    EKG:  EKG is NOT ordered today.    Recent Labs: 05/31/2017: B Natriuretic  Peptide 134.3; TSH 0.918 06/02/2017: ALT 24; BUN 13; Creatinine, Ser 0.74; Hemoglobin 10.1; Magnesium 1.9; Platelets 270; Potassium 4.3; Sodium 137    Lipid Panel No results found for: CHOL, TRIG, HDL, CHOLHDL, VLDL, LDLCALC, LDLDIRECT     Other studies Reviewed: Additional studies/ records that  were reviewed today include: . ECHO 06/01/17 Study Conclusions  - Left ventricle: The cavity size was normal. Systolic function was   normal. The estimated ejection fraction was in the range of 60%   to 65%. Wall motion was normal; there were no regional wall   motion abnormalities. Doppler parameters are consistent with   abnormal left ventricular relaxation (grade 1 diastolic   dysfunction). - Aortic valve: There was mild regurgitation. - Pulmonary arteries: Systolic pressure was mildly increased. PA   peak pressure: 31 mm Hg (S). - Pericardium, extracardiac: There is a moderate circumferential   pericardial effusion mostly localized along right atrial and   right ventricular surface with congealed/organized substance   attached to free wall (? heme/ long standing inflammatory   material).  Radiology/Studies:  Ct Angio Chest Pe W And/or Wo Contrast  Result Date: 05/31/2017 CLINICAL DATA:  Left chest pain. EXAM: CT ANGIOGRAPHY CHEST WITH CONTRAST TECHNIQUE: Multidetector CT imaging of the chest was performed using the standard protocol during bolus administration of intravenous contrast. Multiplanar CT image reconstructions and MIPs were obtained to evaluate the vascular anatomy. CONTRAST:  60 cc Isovue 370 COMPARISON:  Chest radiographs obtained earlier today. FINDINGS: Cardiovascular: Normally opacified pulmonary arteries with no pulmonary arterial filling defects. Borderline enlarged heart. Pericardial effusion with a maximum thickness of 1.5 cm. Atheromatous arterial calcifications, including the coronary arteries and thoracic aorta. Mediastinum/Nodes: No enlarged lymph nodes.  Lungs/Pleura: Small to moderate-sized left pleural effusion. Mild left lower lobe atelectasis and minimal right lower lobe atelectasis. Mild peripheral bilateral lower lobe bullous changes. Upper Abdomen: Multiple liver cysts. Musculoskeletal: Thoracic spine degenerative changes. Review of the MIP images confirms the above findings. IMPRESSION: 1. No pulmonary emboli. 2. Small to moderate-sized left pleural effusion. 3. Small to moderate-sized pericardial effusion. 4. Mild left lower lobe atelectasis and minimal right lower lobe atelectasis. 5. Mild paraseptal emphysema. 6. Calcific coronary artery and aortic atherosclerosis. Aortic Atherosclerosis (ICD10-I70.0) and Emphysema (ICD10-J43.9). Electronically Signed   By: Claudie Revering M.D.   On: 05/31/2017 20:44      ASSESSMENT AND PLAN:  1.  Pericardial effusion moderate on 06/01/17, she was placed on colchicine and NSAIDS and today stable though what sounds pericardial pain with lying flat.  Will repeat limited Echo  Next week.  I will send to Dr. Harrington Challenger for further plan, she will follow up with APP or Dr. Harrington Challenger depending on results.  Neg MI  2.  Pl. Effusion with thoracentesis of 400cc.  To see pulmonary on Wed.  No PE on CT  3.  HTN controlled  4.  Hypothyroid per PCP     Current medicines are reviewed with the patient today.  The patient Has no concerns regarding medicines.  The following changes have been made:  See above Labs/ tests ordered today include:see above  Disposition:   FU:  see above  Signed, Cecilie Kicks, NP  06/10/2017 6:02 PM    [facility], [place], [place], [place] Phone: [phone]; Fax: [phone]

## 2017-06-08 NOTE — Patient Instructions (Addendum)
Medication Instructions: Your physician recommends that you continue on your current medications as directed. Please refer to the Current Medication list given to you today.  Labwork: None Ordered  Procedures/Testing: Your physician has requested that you have a limited echocardiogram. Echocardiography is a painless test that uses sound waves to create images of your heart. It provides your doctor with information about the size and shape of your heart and how well your heart's chambers and valves are working. This procedure takes approximately one hour. There are no restrictions for this procedure.  Follow-Up: Your physician recommends that you schedule a follow-up appointment in: 2-3 WEEKS with Dr. Harrington Challenger  If you need a refill on your cardiac medications before your next appointment, please call your pharmacy.

## 2017-06-10 ENCOUNTER — Encounter: Payer: Self-pay | Admitting: Cardiology

## 2017-06-11 ENCOUNTER — Ambulatory Visit (HOSPITAL_COMMUNITY): Payer: Medicare Other | Attending: Cardiology

## 2017-06-11 ENCOUNTER — Other Ambulatory Visit: Payer: Self-pay

## 2017-06-11 DIAGNOSIS — I10 Essential (primary) hypertension: Secondary | ICD-10-CM | POA: Insufficient documentation

## 2017-06-11 DIAGNOSIS — E785 Hyperlipidemia, unspecified: Secondary | ICD-10-CM | POA: Diagnosis not present

## 2017-06-11 DIAGNOSIS — I313 Pericardial effusion (noninflammatory): Secondary | ICD-10-CM

## 2017-06-11 DIAGNOSIS — R0600 Dyspnea, unspecified: Secondary | ICD-10-CM | POA: Diagnosis not present

## 2017-06-11 DIAGNOSIS — I3139 Other pericardial effusion (noninflammatory): Secondary | ICD-10-CM

## 2017-06-13 ENCOUNTER — Other Ambulatory Visit (INDEPENDENT_AMBULATORY_CARE_PROVIDER_SITE_OTHER): Payer: Medicare Other

## 2017-06-13 ENCOUNTER — Ambulatory Visit (INDEPENDENT_AMBULATORY_CARE_PROVIDER_SITE_OTHER): Payer: Medicare Other | Admitting: Pulmonary Disease

## 2017-06-13 ENCOUNTER — Encounter: Payer: Self-pay | Admitting: Pulmonary Disease

## 2017-06-13 ENCOUNTER — Ambulatory Visit (INDEPENDENT_AMBULATORY_CARE_PROVIDER_SITE_OTHER)
Admission: RE | Admit: 2017-06-13 | Discharge: 2017-06-13 | Disposition: A | Discharge status: Home / Self Care | Payer: Medicare Other | Source: Ambulatory Visit | Attending: Pulmonary Disease | Admitting: Pulmonary Disease

## 2017-06-13 VITALS — BP 126/60 | HR 111 | Ht 66.0 in | Wt 175.0 lb

## 2017-06-13 DIAGNOSIS — J90 Pleural effusion, not elsewhere classified: Secondary | ICD-10-CM

## 2017-06-13 DIAGNOSIS — R3589 Other polyuria: Secondary | ICD-10-CM

## 2017-06-13 DIAGNOSIS — R358 Other polyuria: Secondary | ICD-10-CM | POA: Diagnosis not present

## 2017-06-13 DIAGNOSIS — I3139 Other pericardial effusion (noninflammatory): Secondary | ICD-10-CM

## 2017-06-13 DIAGNOSIS — I313 Pericardial effusion (noninflammatory): Secondary | ICD-10-CM

## 2017-06-13 LAB — URINALYSIS
Bilirubin Urine: NEGATIVE
Hgb urine dipstick: NEGATIVE
Ketones, ur: NEGATIVE
LEUKOCYTES UA: NEGATIVE
Nitrite: NEGATIVE
SPECIFIC GRAVITY, URINE: 1.015 (ref 1.000–1.030)
Total Protein, Urine: NEGATIVE
UROBILINOGEN UA: 0.2 (ref 0.0–1.0)
Urine Glucose: NEGATIVE
pH: 6.5 (ref 5.0–8.0)

## 2017-06-13 NOTE — Progress Notes (Signed)
Subjective:    Patient ID: Chelsea Taylor, female    DOB: [birth year], 76 y.o.   MRN: [id number]  Synopsis: Referred in 2018 for dyspnea after a recent pleural effusion.   *HPI Chief Complaint  Patient presents with  . Advice Only    referred by Dr. Drema Dallas for dyspnea.  Recently hospitalized for pleural effusion    Shelia was recently hospitalized for severe pain that developed in her left chest.  She says that the pain improved when sitting up.  No prior fall. She had been active the day before.  No associated cough.  She tried to lose weight recently.  She smoked for two weeks as a teenager.  She later worked in [employer].    She was also treated with colchicine and she says that the last few days have been fine.  She had some pain in the last two days, but in the last few days things are getting better.    She says that she feels like her symptoms have improved.  However, in the last several weeks she has noticed increasing leg swelling.  She is never been told that she has a heart issue in the past.  She has never been told that she has a lung problem in the past.  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    Past Medical History:  Diagnosis Date  . Collagenous colitis   . Degenerative arthritis    Knees  . Dyslipidemia   . Fibromyalgia   . Hypertension   . Hypothyroid   . Restless legs syndrome (RLS) 02/28/2013  . Sleep apnea      Family History  Problem Relation Age of Onset  . Heart failure Mother   . Heart disease Mother   . Hyperlipidemia Mother   . Heart attack Mother   . Colon cancer Father   . Cancer Father   . Hypertension Father   . Heart attack Brother   . Heart disease Brother   . Diabetes Daughter      Social History   Socioeconomic History  . Marital status: Married    Spouse name: Not on file  . Number of children: 1  . Years of education: 20  . Highest education level: Not on file  Social Needs  . Financial resource strain:  Not on file  . Food insecurity - worry: Not on file  . Food insecurity - inability: Not on file  . Transportation needs - medical: Not on file  . Transportation needs - non-medical: Not on file  Occupational History  . Occupation: Retired  Tobacco Use  . Smoking status: Never Smoker  . Smokeless tobacco: Never Used  Substance and Sexual Activity  . Alcohol use: Yes    Comment: Consumes alcohol twice per year  . Drug use: No  . Sexual activity: Not on file  Other Topics Concern  . Not on file  Social History Narrative  . Not on file     Allergies  Allergen Reactions  . Adhesive [Tape]   . Codeine   . Neosporin [Neomycin-Bacitracin Zn-Polymyx]   . Penicillins Rash    Has patient had a PCN reaction causing immediate rash, facial/tongue/throat swelling, SOB or lightheadedness with hypotension: No Has patient had a PCN reaction causing severe rash involving mucus membranes or skin necrosis: No Has patient had a PCN reaction that required hospitalization: No Has patient had a PCN reaction occurring within the last 10 years: No If all  of the above answers are "NO", then may proceed with Cephalosporin use.     Outpatient Medications Prior to Visit  Medication Sig Dispense Refill  . Ascorbic Acid (VITAMIN C) 500 MG CHEW Chew 500 mg by mouth daily.     Marland Kitchen aspirin 81 MG tablet Take 81 mg by mouth at bedtime.     . calcium gluconate 500 MG tablet Take 500 mg by mouth 2 (two) times daily.     . Cholecalciferol (VITAMIN D) 2000 units CAPS Take 2,000 Units by mouth daily.     . CoenzymeQ10-Isoleucine-Glycine (CO Q-10) 100-50-25 MG TB24 Take 100 mg by mouth daily.    . colchicine 0.6 MG tablet Take 1 tablet (0.6 mg total) by mouth 2 (two) times daily. 60 tablet 1  . fish oil-omega-3 fatty acids 1000 MG capsule Take 2 g by mouth 2 (two) times daily.     . folic acid (FOLVITE) 595 MCG tablet Take 800 mcg by mouth at bedtime.     . Glucosamine HCl 1500 MG TABS Take 1,500 mg by mouth 2 (two)  times daily.     Marland Kitchen ibuprofen (ADVIL,MOTRIN) 400 MG tablet Take 1 tablet (400 mg total) by mouth 4 (four) times daily. 30 tablet 0  . losartan (COZAAR) 50 MG tablet Take 50 mg by mouth daily.    . magnesium oxide (MAG-OX) 400 MG tablet Take 400 mg by mouth daily.    . Multiple Vitamin (MULTIVITAMIN) tablet Take 1 tablet by mouth daily.    . Multiple Vitamins-Minerals (ICAPS MV PO) Take 1 tablet by mouth at bedtime.    Marland Kitchen oxyCODONE (OXY IR/ROXICODONE) 5 MG immediate release tablet Take 1 tablet (5 mg total) by mouth every 6 (six) hours as needed for moderate pain or severe pain. 20 tablet 0  . pantoprazole (PROTONIX) 40 MG tablet Take 1 tablet (40 mg total) by mouth daily at 12 noon. 30 tablet 0  . Red Yeast Rice 600 MG CAPS Take 600 mg by mouth 2 (two) times daily.    Marland Kitchen rOPINIRole (REQUIP) 1 MG tablet Take 2 mg by mouth. 2 mg  tablets in afternoon and 3 mg tablets at bedtime    . sulfaSALAzine (AZULFIDINE) 500 MG EC tablet Take 1,500 mg by mouth 2 (two) times daily.    Marland Kitchen SYNTHROID 137 MCG tablet Take 137 mcg by mouth daily.     . vitamin B-12 (CYANOCOBALAMIN) 100 MCG tablet Take 100 mcg by mouth at bedtime.     No facility-administered medications prior to visit.       Review of Systems  Constitutional: Positive for fatigue. Negative for fever and unexpected weight change.  HENT: Negative for congestion, dental problem, ear pain, nosebleeds, postnasal drip, rhinorrhea, sinus pressure, sneezing, sore throat and trouble swallowing.   Eyes: Negative for redness and itching.  Respiratory: Positive for chest tightness and shortness of breath. Negative for cough and wheezing.   Cardiovascular: Negative for palpitations and leg swelling.  Gastrointestinal: Negative for nausea and vomiting.  Genitourinary: Negative for dysuria.  Musculoskeletal: Negative for joint swelling.  Skin: Negative for rash.  Neurological: Negative for headaches.  Hematological: Does not bruise/bleed easily.    Psychiatric/Behavioral: Negative for dysphoric mood. The patient is not nervous/anxious.        Objective:   Physical Exam Vitals:   06/13/17 1402  BP: 126/60  Pulse: (!) 111  SpO2: 98%  Weight: 175 lb (79.4 kg)  Height: 5\' 6"  (1.676 m)   Gen: well appearing, no  acute distress HENT: NCAT, OP clear, neck supple without masses Eyes: PERRL, EOMi Lymph: no cervical lymphadenopathy PULM: CTA B CV: RRR, no mgr, no JVD GI: BS+, soft, nontender, no hsm Derm: trace pretibial edema, no rash or skin breakdown MSK: normal bulk and tone Neuro: A&Ox4, CN II-XII intact, strength 5/5 in all 4 extremities Psyche: normal mood and affect   CBC    Component Value Date/Time   WBC 8.3 06/02/2017 0657   RBC 3.21 (L) 06/02/2017 0657   HGB 10.1 (L) 06/02/2017 0657   HCT 30.5 (L) 06/02/2017 0657   PLT 270 06/02/2017 0657   MCV 95.0 06/02/2017 0657   MCH 31.5 06/02/2017 0657   MCHC 33.1 06/02/2017 0657   RDW 12.8 06/02/2017 0657   LYMPHSABS 2.1 06/02/2017 0657   MONOABS 1.0 06/02/2017 0657   EOSABS 0.3 06/02/2017 0657   BASOSABS 0.0 06/02/2017 0657    BMET    Component Value Date/Time   NA 137 06/02/2017 0657   K 4.3 06/02/2017 0657   CL 104 06/02/2017 0657   CO2 25 06/02/2017 0657   GLUCOSE 113 (H) 06/02/2017 0657   BUN 13 06/02/2017 0657   CREATININE 0.74 06/02/2017 0657   CALCIUM 8.7 (L) 06/02/2017 0657   GFRNONAA >60 06/02/2017 0657   GFRAA >60 06/02/2017 0657   Chest imaging: October 2018 CT chest images independently reviewed showing a pericardial effusion, small pleural effusion, atelectasis left base June 05, 2017 chest x-ray atelectasis left base slight pleural effusion  Pathology: October 2018 left pleural effusion negative     Assessment & Plan:    Polyuria - Plan: Urinalysis  Pleural effusion - Plan: DG Chest 2 View, Histone antibodies, IgG, blood  Pericardial effusion  Discussion: Edward presents for a first time evaluation of an exudative pleural  effusion that occurred suddenly in the setting of acute onset left-sided chest pain.  During her hospitalization she was ruled out for a pulmonary embolism with a CT angiogram, thoracentesis was performed which showed an exudative pleural effusion, culture negative, cell count not consistent with active bacterial infection, and cytology negative.  At this point the differential diagnosis includes a viral pleurisy versus less likely an autoimmune condition like drug-induced lupus.  Given the pericardial effusion and the finding suggestive of constriction I will be interested to see recommendations from cardiology after next week's visit.  In the meantime I will check lab work to look to see if there is evidence of drug-induced lupus, will repeat a chest x-ray today to make sure the pleural effusion is not coming back.  She is also been noting polyuria recently.  It is uncertain why this may be the case.  We will check a urinalysis to evaluate further.  Pleural effusion: Hopefully this was just a viral process I will send a lab test called antihistone antibodies to make sure you are not having a reaction to your medications We will check a chest x-ray today to make sure the fluid collections not coming back Let us know if you develop worsening shortness of breath We will see you back in 1 week with a repeat chest x-ray with 1 of our nurse practitioners  Pericardial effusion: Again, likely a viral process Because of the possible constriction seen on the echocardiogram keep your follow-up appointment with Dr. Harrington Challenger  Polyuria: It is unclear to me if this has anything to do with this process in your lungs We will check a urinalysis to see if there is something else going on  We will see you back in 1 week with a nurse practitioner with a chest x-ray    Current Outpatient Medications:  .  Ascorbic Acid (VITAMIN C) 500 MG CHEW, Chew 500 mg by mouth daily. , Disp: , Rfl:  .  aspirin 81 MG tablet,  Take 81 mg by mouth at bedtime. , Disp: , Rfl:  .  calcium gluconate 500 MG tablet, Take 500 mg by mouth 2 (two) times daily. , Disp: , Rfl:  .  Cholecalciferol (VITAMIN D) 2000 units CAPS, Take 2,000 Units by mouth daily. , Disp: , Rfl:  .  CoenzymeQ10-Isoleucine-Glycine (CO Q-10) 100-50-25 MG TB24, Take 100 mg by mouth daily., Disp: , Rfl:  .  colchicine 0.6 MG tablet, Take 1 tablet (0.6 mg total) by mouth 2 (two) times daily., Disp: 60 tablet, Rfl: 1 .  fish oil-omega-3 fatty acids 1000 MG capsule, Take 2 g by mouth 2 (two) times daily. , Disp: , Rfl:  .  folic acid (FOLVITE) 903 MCG tablet, Take 800 mcg by mouth at bedtime. , Disp: , Rfl:  .  Glucosamine HCl 1500 MG TABS, Take 1,500 mg by mouth 2 (two) times daily. , Disp: , Rfl:  .  ibuprofen (ADVIL,MOTRIN) 400 MG tablet, Take 1 tablet (400 mg total) by mouth 4 (four) times daily., Disp: 30 tablet, Rfl: 0 .  losartan (COZAAR) 50 MG tablet, Take 50 mg by mouth daily., Disp: , Rfl:  .  magnesium oxide (MAG-OX) 400 MG tablet, Take 400 mg by mouth daily., Disp: , Rfl:  .  Multiple Vitamin (MULTIVITAMIN) tablet, Take 1 tablet by mouth daily., Disp: , Rfl:  .  Multiple Vitamins-Minerals (ICAPS MV PO), Take 1 tablet by mouth at bedtime., Disp: , Rfl:  .  oxyCODONE (OXY IR/ROXICODONE) 5 MG immediate release tablet, Take 1 tablet (5 mg total) by mouth every 6 (six) hours as needed for moderate pain or severe pain., Disp: 20 tablet, Rfl: 0 .  pantoprazole (PROTONIX) 40 MG tablet, Take 1 tablet (40 mg total) by mouth daily at 12 noon., Disp: 30 tablet, Rfl: 0 .  Red Yeast Rice 600 MG CAPS, Take 600 mg by mouth 2 (two) times daily., Disp: , Rfl:  .  rOPINIRole (REQUIP) 1 MG tablet, Take 2 mg by mouth. 2 mg  tablets in afternoon and 3 mg tablets at bedtime, Disp: , Rfl:  .  sulfaSALAzine (AZULFIDINE) 500 MG EC tablet, Take 1,500 mg by mouth 2 (two) times daily., Disp: , Rfl:  .  SYNTHROID 137 MCG tablet, Take 137 mcg by mouth daily. , Disp: , Rfl:  .   vitamin B-12 (CYANOCOBALAMIN) 100 MCG tablet, Take 100 mcg by mouth at bedtime., Disp: , Rfl:

## 2017-06-13 NOTE — Patient Instructions (Signed)
Pleural effusion: Hopefully this was just a viral process I will send a lab test called antihistone antibodies to make sure you are not having a reaction to your medications We will check a chest x-ray today to make sure the fluid collections not coming back Let us know if you develop worsening shortness of breath We will see you back in 1 week with a repeat chest x-ray with 1 of our nurse practitioners  Pericardial effusion: Again, likely a viral process Because of the possible constriction seen on the echocardiogram keep your follow-up appointment with Dr. Harrington Challenger  Polyuria: It is unclear to me if this has anything to do with this process in your lungs We will check a urinalysis to see if there is something else going on  We will see you back in 1 week with a nurse practitioner with a chest x-ray

## 2017-06-18 ENCOUNTER — Encounter: Payer: Self-pay | Admitting: Internal Medicine

## 2017-06-18 ENCOUNTER — Ambulatory Visit: Payer: Medicare Other | Admitting: Internal Medicine

## 2017-06-18 DIAGNOSIS — I300 Acute nonspecific idiopathic pericarditis: Secondary | ICD-10-CM | POA: Diagnosis not present

## 2017-06-18 DIAGNOSIS — R000 Tachycardia, unspecified: Secondary | ICD-10-CM

## 2017-06-18 DIAGNOSIS — I10 Essential (primary) hypertension: Secondary | ICD-10-CM | POA: Diagnosis not present

## 2017-06-18 NOTE — Progress Notes (Signed)
Cardiology Office Note   Date:  06/18/2017   ID:  Chelsea Taylor, DOB 1941/03/15, MRN [id number]  PCP:  Leighton Ruff, MD  Cardiologist:  New Dr. Harrington Challenger     F/U of pericardial effusion     History of Present Illness: Chelsea Taylor is a 76 y.o. female who presents for post hospitalization  hx of normal stress test in 2007, HTN, hypothyroidism, microscopic colitis  I sasw her in the hosp for CP and pericardial effusion   Echo with mod peridcardial effuison surrounding heart   Some organization seen of this along inferior surface of RV    CT as noted above  Note also pt hs CAD  No masses noted   EKG with fairly diffuse ST elevation though not classic  IShe had thoracentesis of 400cc treating pericardial effusion with colchicine and NSAIDS.  Her pathology returned with no malignant but reactive mesothelial cells, to see pulmonary on Wed. Next week.   Overall feeling better. She is SOB with exertion.  No dizziness or syncope     Past Medical History:  Diagnosis Date  . Collagenous colitis   . Degenerative arthritis    Knees  . Dyslipidemia   . Fibromyalgia   . Hypertension   . Hypothyroid   . Restless legs syndrome (RLS) 02/28/2013  . Sleep apnea     Past Surgical History:  Procedure Laterality Date  . APPENDECTOMY    . BACK SURGERY  2012  . BREAST BIOPSY Left    Fibroadenoma  . CATARACT EXTRACTION    . DILATION AND CURETTAGE OF UTERUS    . FOOT SURGERY Bilateral    multiple  . [place]  2014  . [place]  2007  . HYSTEROSCOPY    . TONSILLECTOMY       Current Outpatient Medications  Medication Sig Dispense Refill  . aspirin 81 MG tablet Take 81 mg by mouth at bedtime.     . calcium gluconate 500 MG tablet Take 500 mg by mouth 2 (two) times daily.     . Cholecalciferol (VITAMIN D) 2000 units CAPS Take 2,000 Units by mouth daily.     . CoenzymeQ10-Isoleucine-Glycine (CO Q-10) 100-50-25 MG TB24 Take 100 mg by mouth daily.    . colchicine  0.6 MG tablet Take 1 tablet (0.6 mg total) by mouth 2 (two) times daily. 60 tablet 1  . fish oil-omega-3 fatty acids 1000 MG capsule Take 2 g by mouth 2 (two) times daily.     . folic acid (FOLVITE) 818 MCG tablet Take 800 mcg by mouth at bedtime.     . Glucosamine HCl 1500 MG TABS Take 1,500 mg by mouth 2 (two) times daily.     Marland Kitchen ibuprofen (ADVIL,MOTRIN) 400 MG tablet Take 1 tablet (400 mg total) by mouth 4 (four) times daily. 30 tablet 0  . losartan (COZAAR) 50 MG tablet Take 50 mg by mouth daily.    . magnesium oxide (MAG-OX) 400 MG tablet Take 400 mg by mouth daily.    . Multiple Vitamin (MULTIVITAMIN) tablet Take 1 tablet by mouth daily.    . Multiple Vitamins-Minerals (ICAPS MV PO) Take 1 tablet by mouth at bedtime.    Marland Kitchen oxyCODONE (OXY IR/ROXICODONE) 5 MG immediate release tablet Take 1 tablet (5 mg total) by mouth every 6 (six) hours as needed for moderate pain or severe pain. 20 tablet 0  . pantoprazole (PROTONIX) 40 MG tablet Take 1 tablet (40 mg total) by mouth  daily at 12 noon. 30 tablet 0  . Red Yeast Rice 600 MG CAPS Take 600 mg by mouth 2 (two) times daily.    Marland Kitchen rOPINIRole (REQUIP) 1 MG tablet Take 2 mg by mouth. 2 mg  tablets in afternoon and 3 mg tablets at bedtime    . sulfaSALAzine (AZULFIDINE) 500 MG EC tablet Take 1,500 mg by mouth 2 (two) times daily.    Marland Kitchen SYNTHROID 137 MCG tablet Take 137 mcg by mouth daily.     . vitamin B-12 (CYANOCOBALAMIN) 100 MCG tablet Take 100 mcg by mouth at bedtime.    . vitamin C (ASCORBIC ACID) 500 MG tablet Take 500 mg daily by mouth.     No current facility-administered medications for this visit.     Allergies:   Adhesive [tape]; Codeine; Neosporin [neomycin-bacitracin zn-polymyx]; and Penicillins    Social History:  The patient  reports that  has never smoked. she has never used smokeless tobacco. She reports that she drinks alcohol. She reports that she does not use drugs.   Family History:  The patient's family history includes Cancer  in her father; Colon cancer in her father; Diabetes in her daughter; Heart attack in her brother and mother; Heart disease in her brother and mother; Heart failure in her mother; Hyperlipidemia in her mother; Hypertension in her father.    ROS:  General:no colds or fevers, + weight changes according to wts below Skin:no rashes or ulcers HEENT:no blurred vision, no congestion CV:see HPI PUL:see HPI GI:no diarrhea constipation or melena, no indigestion GU:no hematuria, no dysuria MS:no joint pain, no claudication Neuro:no syncope, no lightheadedness Endo:no diabetes, no thyroid disease  Wt Readings from Last 3 Encounters:  06/18/17 174 lb 1.9 oz (79 kg)  06/13/17 175 lb (79.4 kg)  06/08/17 177 lb 1.9 oz (80.3 kg)     PHYSICAL EXAM: VS:  BP 140/68   Pulse (!) 109   Ht 5\' 6"  (1.676 m)   Wt 174 lb 1.9 oz (79 kg)   SpO2 97%   BMI 28.10 kg/m  , BMI Body mass index is 28.1 kg/m. General:Pleasant affect, NAD Skin:Warm and dry, brisk capillary refill HEENT:normocephalic, sclera clear, mucus membranes moist Neck:supple, no JVD, no bruits  Heart:S1S2 RRR without murmur, gallup, No rubs   Lungs:clear without rales, rhonchi, or wheezes SWF:UXNA, non tender, + BS, do not palpate liver spleen or masses Ext:no lower ext edema, 2+ pedal pulses, 2+ radial pulses Neuro:alert and oriented X 3, MAE, follows commands, + facial symmetry    EKG:  EKG is NOT ordered today.    Recent Labs: 05/31/2017: B Natriuretic Peptide 134.3; TSH 0.918 06/02/2017: ALT 24; BUN 13; Creatinine, Ser 0.74; Hemoglobin 10.1; Magnesium 1.9; Platelets 270; Potassium 4.3; Sodium 137    Lipid Panel No results found for: CHOL, TRIG, HDL, CHOLHDL, VLDL, LDLCALC, LDLDIRECT     Other studies Reviewed: Additional studies/ records that were reviewed today include: . ECHO 06/01/17 Study Conclusions  - Left ventricle: The cavity size was normal. Systolic function was   normal. The estimated ejection fraction was  in the range of 60%   to 65%. Wall motion was normal; there were no regional wall   motion abnormalities. Doppler parameters are consistent with   abnormal left ventricular relaxation (grade 1 diastolic   dysfunction). - Aortic valve: There was mild regurgitation. - Pulmonary arteries: Systolic pressure was mildly increased. PA   peak pressure: 31 mm Hg (S). - Pericardium, extracardiac: There is a moderate circumferential  pericardial effusion mostly localized along right atrial and   right ventricular surface with congealed/organized substance   attached to free wall (? heme/ long standing inflammatory   material).  Radiology/Studies:  Ct Angio Chest Pe W And/or Wo Contrast  Result Date: 05/31/2017 CLINICAL DATA:  Left chest pain. EXAM: CT ANGIOGRAPHY CHEST WITH CONTRAST TECHNIQUE: Multidetector CT imaging of the chest was performed using the standard protocol during bolus administration of intravenous contrast. Multiplanar CT image reconstructions and MIPs were obtained to evaluate the vascular anatomy. CONTRAST:  60 cc Isovue 370 COMPARISON:  Chest radiographs obtained earlier today. FINDINGS: Cardiovascular: Normally opacified pulmonary arteries with no pulmonary arterial filling defects. Borderline enlarged heart. Pericardial effusion with a maximum thickness of 1.5 cm. Atheromatous arterial calcifications, including the coronary arteries and thoracic aorta. Mediastinum/Nodes: No enlarged lymph nodes. Lungs/Pleura: Small to moderate-sized left pleural effusion. Mild left lower lobe atelectasis and minimal right lower lobe atelectasis. Mild peripheral bilateral lower lobe bullous changes. Upper Abdomen: Multiple liver cysts. Musculoskeletal: Thoracic spine degenerative changes. Review of the MIP images confirms the above findings. IMPRESSION: 1. No pulmonary emboli. 2. Small to moderate-sized left pleural effusion. 3. Small to moderate-sized pericardial effusion. 4. Mild left lower lobe  atelectasis and minimal right lower lobe atelectasis. 5. Mild paraseptal emphysema. 6. Calcific coronary artery and aortic atherosclerosis. Aortic Atherosclerosis (ICD10-I70.0) and Emphysema (ICD10-J43.9). Electronically Signed   By: Claudie Revering M.D.   On: 05/31/2017 20:44      ASSESSMENT AND PLAN:  1.  Pericardial effusion moderate on 06/01/17, I have reviewed repeat echo on 11/5  I am not convinced of effusive/constrictive physiology   I would continue to follow and treat with colchicine   I still think above and pleural effusion have viral etiology    2.  Pl. Effusion with thoracentesis of 400cc.  To see pulmonary on Wed.  No PE on CT  3.  HTN controlled  4.  Hypothyroid per PCP     Current medicines are reviewed with the patient today.  The patient Has no concerns regarding medicines.  The following changes have been made:  See above Labs/ tests ordered today include:see above  Disposition:   FU:  see above  Signed, Dorris Carnes, MD  06/18/2017 11:40 AM    [facility], [place], [place], [place] Phone: [phone]; Fax: [phone]

## 2017-06-18 NOTE — Patient Instructions (Signed)
Your physician recommends that you continue on your current medications as directed. Please refer to the Current Medication list given to you today.  Your physician recommends that you return for lab work today (CRP, SED RATE)  We will contact you with follow up information

## 2017-06-19 ENCOUNTER — Telehealth: Payer: Self-pay | Admitting: Internal Medicine

## 2017-06-19 DIAGNOSIS — I313 Pericardial effusion (noninflammatory): Secondary | ICD-10-CM

## 2017-06-19 DIAGNOSIS — I3139 Other pericardial effusion (noninflammatory): Secondary | ICD-10-CM

## 2017-06-19 LAB — C-REACTIVE PROTEIN: CRP: 61.7 mg/L — AB (ref 0.0–4.9)

## 2017-06-19 LAB — SEDIMENTATION RATE: SED RATE: 32 mm/h (ref 0–40)

## 2017-06-19 LAB — HISTONE ANTIBODIES, IGG, BLOOD: Histone Ab: <1 U (ref <1.0)

## 2017-06-19 NOTE — Telephone Encounter (Signed)
Spoke with daughter, Anderson [place], [place] on file.  She states that Dr. Harrington Challenger mentioned to pt at appt yesterday that she will want to repeat the echo prior to seeing pt back in 2 weeks.  No order in system.  Advised I will have to send message to Dr. Harrington Challenger to see if she wanted echo repeated and see if limited or complete?  Also daughter states f/u appt is set up for 12/6 which is over 3 weeks out.  She wants to know if this should be sooner?  Will route to Dr. Harrington Challenger for review and advisement.

## 2017-06-19 NOTE — Telephone Encounter (Signed)
Patient daughter, Anderson [place] calling on behalf of patient. Patient was told she needed an echo but has not heard anything. I did not see order for echo.

## 2017-06-20 ENCOUNTER — Ambulatory Visit: Payer: Medicare Other | Admitting: Adult Health

## 2017-06-20 ENCOUNTER — Encounter: Payer: Self-pay | Admitting: Adult Health

## 2017-06-20 DIAGNOSIS — J90 Pleural effusion, not elsewhere classified: Secondary | ICD-10-CM

## 2017-06-20 NOTE — Telephone Encounter (Signed)
Follow up     Pt daughter was returning call from [facility] regarding lab results , said leave complete message on vm if she doesn't answer.

## 2017-06-20 NOTE — Patient Instructions (Signed)
Continue on Follow up with Cardiology .  Follow up with Dr. Lake Bells in 2-3 months and As needed

## 2017-06-20 NOTE — Progress Notes (Signed)
@Patient  ID: Chelsea Taylor, female    DOB: 1941-06-02, 76 y.o.   MRN: [id number]  Chief Complaint  Patient presents with  . Follow-up    Effusion     Referring provider: Leighton Ruff, MD  HPI: 77 year old female never smoker seen for pulmonary consult for pleural effusion and Pericardial effusion on June 13, 2017. Past medical history significant for colitis  TEST   Chest imaging: October 2018 CT chest images independently reviewed showing a pericardial effusion, small pleural effusion, atelectasis left base June 05, 2017 chest x-ray atelectasis left base slight pleural effusion  Pathology: October 2018 left pleural effusion negative  06/20/2017 Follow up : Pleural effusion  Patient presents for a one week follow-up.  Patient was seen last week for a pulmonary consult for pleural effusion.  Patient recently been admitted late October with left-sided chest pain.  She was found to have a left pleural effusion and pericardial effusion.  CT chest was negative for PE.Marland Kitchen Patient underwent a thoracentesis that showed an exudative effusion.  Cultures were negative.  Cytology was negative.  Labs done in the office last week showed a negative histone antibody.. Follow-up chest x-ray showed resolution and no recurrence of pleural effusion. Patient is being followed by cardiology, follow-up 2D echo on 11/5. howed a smaller  free-flowing pericardial effusion with constrictive appearance.  She had been treated with Colchicine .  Review from cardiology notes CRP was elevated sed rate had decreased She is feeling better with less dyspnea. Chest wall /pleuritic chest pain has improved.    Allergies  Allergen Reactions  . Adhesive [Tape]   . Codeine   . Neosporin [Neomycin-Bacitracin Zn-Polymyx]   . Penicillins Rash    Has patient had a PCN reaction causing immediate rash, facial/tongue/throat swelling, SOB or lightheadedness with hypotension: No Has patient had a PCN reaction  causing severe rash involving mucus membranes or skin necrosis: No Has patient had a PCN reaction that required hospitalization: No Has patient had a PCN reaction occurring within the last 10 years: No If all of the above answers are "NO", then may proceed with Cephalosporin use.    Immunization History  Administered Date(s) Administered  . Influenza, High Dose Seasonal PF 05/25/2017    Past Medical History:  Diagnosis Date  . Collagenous colitis   . Degenerative arthritis    Knees  . Dyslipidemia   . Fibromyalgia   . Hypertension   . Hypothyroid   . Restless legs syndrome (RLS) 02/28/2013  . Sleep apnea     Tobacco History: Social History   Tobacco Use  Smoking Status Never Smoker  Smokeless Tobacco Never Used   Counseling given: Not Answered   Outpatient Encounter Medications as of 06/20/2017  Medication Sig  . aspirin 81 MG tablet Take 81 mg by mouth at bedtime.   . calcium gluconate 500 MG tablet Take 500 mg by mouth 2 (two) times daily.   . Cholecalciferol (VITAMIN D) 2000 units CAPS Take 2,000 Units by mouth daily.   . CoenzymeQ10-Isoleucine-Glycine (CO Q-10) 100-50-25 MG TB24 Take 100 mg by mouth daily.  . colchicine 0.6 MG tablet Take 1 tablet (0.6 mg total) by mouth 2 (two) times daily.  . fish oil-omega-3 fatty acids 1000 MG capsule Take 2 g by mouth 2 (two) times daily.   . folic acid (FOLVITE) 782 MCG tablet Take 800 mcg by mouth at bedtime.   . Glucosamine HCl 1500 MG TABS Take 1,500 mg by mouth 2 (two) times daily.   Marland Kitchen  losartan (COZAAR) 50 MG tablet Take 50 mg by mouth daily.  . magnesium oxide (MAG-OX) 400 MG tablet Take 400 mg by mouth daily.  . Multiple Vitamin (MULTIVITAMIN) tablet Take 1 tablet by mouth daily.  . Multiple Vitamins-Minerals (ICAPS MV PO) Take 1 tablet by mouth at bedtime.  . pantoprazole (PROTONIX) 40 MG tablet Take 1 tablet (40 mg total) by mouth daily at 12 noon.  . Red Yeast Rice 600 MG CAPS Take 600 mg by mouth 2 (two) times  daily.  Marland Kitchen rOPINIRole (REQUIP) 1 MG tablet Take 2 mg by mouth. 2 mg  tablets in afternoon and 3 mg tablets at bedtime  . sulfaSALAzine (AZULFIDINE) 500 MG EC tablet Take 1,500 mg by mouth 2 (two) times daily.  Marland Kitchen SYNTHROID 137 MCG tablet Take 137 mcg by mouth daily.   . vitamin B-12 (CYANOCOBALAMIN) 100 MCG tablet Take 100 mcg by mouth at bedtime.  . vitamin C (ASCORBIC ACID) 500 MG tablet Take 500 mg daily by mouth.  . [DISCONTINUED] ibuprofen (ADVIL,MOTRIN) 400 MG tablet Take 1 tablet (400 mg total) by mouth 4 (four) times daily. (Patient not taking: Reported on 06/20/2017)  . [DISCONTINUED] oxyCODONE (OXY IR/ROXICODONE) 5 MG immediate release tablet Take 1 tablet (5 mg total) by mouth every 6 (six) hours as needed for moderate pain or severe pain. (Patient not taking: Reported on 06/20/2017)   No facility-administered encounter medications on file as of 06/20/2017.      Review of Systems  Constitutional:   No  weight loss, night sweats,  Fevers, chills, fatigue, or  lassitude.  HEENT:   No headaches,  Difficulty swallowing,  Tooth/dental problems, or  Sore throat,                No sneezing, itching, ear ache, nasal congestion, post nasal drip,   CV:  No chest pain,  Orthopnea, PND, swelling in lower extremities, anasarca, dizziness, palpitations, syncope.   GI  No heartburn, indigestion, abdominal pain, nausea, vomiting, diarrhea, change in bowel habits, loss of appetite, bloody stools.   Resp: No shortness of breath with exertion or at rest.  No excess mucus, no productive cough,  No non-productive cough,  No coughing up of blood.  No change in color of mucus.  No wheezing.  No chest wall deformity  Skin: no rash or lesions.  GU: no dysuria, change in color of urine, no urgency or frequency.  No flank pain, no hematuria   MS:  No joint pain or swelling.  No decreased range of motion.  No back pain.    Physical Exam  BP 114/60 (BP Location: Left Arm, Cuff Size: Normal)   Pulse  91   Ht 5\' 6"  (1.676 m)   Wt 173 lb (78.5 kg)   SpO2 96%   BMI 27.92 kg/m   GEN: A/Ox3; pleasant , NAD, thin and elderly    HEENT:  [place]/AT,  EACs-clear, TMs-wnl, NOSE-clear, THROAT-clear, no lesions, no postnasal drip or exudate noted.   NECK:  Supple w/ fair ROM; no JVD; normal carotid impulses w/o bruits; no thyromegaly or nodules palpated; no lymphadenopathy.    RESP  Clear  P & A; w/o, wheezes/ rales/ or rhonchi. no accessory muscle use, no dullness to percussion  CARD:  RRR, no m/r/g, no peripheral edema, pulses intact, no cyanosis or clubbing.  GI:   Soft & nt; nml bowel sounds; no organomegaly or masses detected.   Musco: Warm bil, no deformities or joint swelling noted.   Neuro:  alert, no focal deficits noted.    Skin: Warm, no lesions or rashes    Lab Results:   BMET  ProBNP No results found for: PROBNP  Imaging: Dg Chest 1 View  Result Date: 06/03/2017 CLINICAL DATA:  Pleural effusion status post left thoracentesis. EXAM: CHEST 1 VIEW COMPARISON:  06/02/2017 FINDINGS: The cardiomediastinal silhouette is unchanged. There is a small left pleural effusion, decreased in size following thoracentesis. Persistent left basilar airspace opacity remains. Slight blunting of the right costophrenic angle is unchanged and suggests a small pleural effusion. No pneumothorax is identified. IMPRESSION: 1. Small pleural effusions, decreased on the left following thoracentesis. No pneumothorax. 2. Persistent left basilar atelectasis or consolidation. Electronically Signed   By: Logan Bores M.D.   On: 06/03/2017 09:39   Dg Chest 2 View  Result Date: 06/14/2017 CLINICAL DATA:  Follow-up bilateral pleural effusions. Continued shortness of breath. EXAM: CHEST  2 VIEW COMPARISON:  06/05/2017 and prior radiographs FINDINGS: The cardiomediastinal silhouette is unremarkable. Left basilar atelectasis has resolved. There is no evidence of focal airspace disease, pulmonary edema, suspicious  pulmonary nodule/mass, pleural effusion, or pneumothorax. No acute bony abnormalities are identified. IMPRESSION: No active cardiopulmonary disease. Small left pleural effusion and left basilar atelectasis have resolved. Electronically Signed   By: Margarette Canada M.D.   On: 06/14/2017 08:15   Dg Chest 2 View  Result Date: 06/02/2017 CLINICAL DATA:  Right-sided chest pain. EXAM: CHEST  2 VIEW COMPARISON:  CT 05/31/2017 and chest radiograph 05/31/2017 FINDINGS: Persistent densities at the left chest base compatible with pleural fluid and consolidation. New blunting at the right costophrenic angle suggestive for small right pleural effusion. Upper lungs are clear without pulmonary edema. Heart size is within normal limits. Bony thorax is intact. Negative for a pneumothorax. IMPRESSION: Bilateral pleural effusions, left side greater than right. Volume loss/consolidation at the left lung base. Electronically Signed   By: Markus Daft M.D.   On: 06/02/2017 14:40   Ct Angio Chest Pe W And/or Wo Contrast  Result Date: 05/31/2017 CLINICAL DATA:  Left chest pain. EXAM: CT ANGIOGRAPHY CHEST WITH CONTRAST TECHNIQUE: Multidetector CT imaging of the chest was performed using the standard protocol during bolus administration of intravenous contrast. Multiplanar CT image reconstructions and MIPs were obtained to evaluate the vascular anatomy. CONTRAST:  60 cc Isovue 370 COMPARISON:  Chest radiographs obtained earlier today. FINDINGS: Cardiovascular: Normally opacified pulmonary arteries with no pulmonary arterial filling defects. Borderline enlarged heart. Pericardial effusion with a maximum thickness of 1.5 cm. Atheromatous arterial calcifications, including the coronary arteries and thoracic aorta. Mediastinum/Nodes: No enlarged lymph nodes. Lungs/Pleura: Small to moderate-sized left pleural effusion. Mild left lower lobe atelectasis and minimal right lower lobe atelectasis. Mild peripheral bilateral lower lobe bullous  changes. Upper Abdomen: Multiple liver cysts. Musculoskeletal: Thoracic spine degenerative changes. Review of the MIP images confirms the above findings. IMPRESSION: 1. No pulmonary emboli. 2. Small to moderate-sized left pleural effusion. 3. Small to moderate-sized pericardial effusion. 4. Mild left lower lobe atelectasis and minimal right lower lobe atelectasis. 5. Mild paraseptal emphysema. 6. Calcific coronary artery and aortic atherosclerosis. Aortic Atherosclerosis (ICD10-I70.0) and Emphysema (ICD10-J43.9). Electronically Signed   By: Claudie Revering M.D.   On: 05/31/2017 20:44   US Thoracentesis Asp Pleural Space W/img Guide  Result Date: 06/03/2017 INDICATION: Symptomatic left sided pleural effusion EXAM: US THORACENTESIS ASP PLEURAL SPACE W/IMG GUIDE COMPARISON:  None. MEDICATIONS: 10 cc 1% lidocaine. COMPLICATIONS: None immediate. TECHNIQUE: Informed written consent was obtained from the patient after a  discussion of the risks, benefits and alternatives to treatment. A timeout was performed prior to the initiation of the procedure. Initial ultrasound scanning demonstrates a left pleural effusion. The lower chest was prepped and draped in the usual sterile fashion. 1% lidocaine was used for local anesthesia. Under direct ultrasound guidance, a 19 gauge, 7-cm, Yueh catheter was introduced. An ultrasound image was saved for documentation purposes. The thoracentesis was performed. The catheter was removed and a dressing was applied. The patient tolerated the procedure well without immediate post procedural complication. The patient was escorted to have an upright chest radiograph. FINDINGS: A total of approximately 480 cc of yellow fluid was removed. Requested samples were sent to the laboratory. IMPRESSION: Successful ultrasound-guided left sided thoracentesis yielding 480 cc of pleural fluid. Follow-up chest radiograph shows no pneumothorax. Read by Lavonia Drafts [facility] Electronically Signed   By: Lucrezia Europe M.D.   On: 06/03/2017 09:34     Assessment & Plan:   Pleural effusion Left pleural effusion resolved on chest xray  Workup has been unrevealing  Associated pericardial effusion seems to be improving as well.   Cont to monitor   Plan  Patient Instructions  Continue on Follow up with Cardiology .  Follow up with Dr. Lake Bells in 2-3 months and As needed          Rexene Edison, NP 06/20/2017

## 2017-06-20 NOTE — Telephone Encounter (Signed)
-----   Message from Fay Records, MD sent at 06/20/2017 12:00 AM EST ----- Inflammation markers are not signif worse still not normalized.   Would recomm f/u echo in 3 wks

## 2017-06-20 NOTE — Assessment & Plan Note (Signed)
Left pleural effusion resolved on chest xray  Workup has been unrevealing  Associated pericardial effusion seems to be improving as well.   Cont to monitor   Plan  Patient Instructions  Continue on Follow up with Cardiology .  Follow up with Dr. Lake Bells in 2-3 months and As needed

## 2017-06-20 NOTE — Telephone Encounter (Signed)
Called and spoke to daughter Chelsea Taylor (DPR on file) and made her aware of patient's lab results and recommendations to have another echocardiogram done. Patient had an appointment on 12/6 at 11:00 AM. The desire was to have the echocardiogram done prior to the appointment, but daughter would like for the patient to come in later in the day. Echo was ordered and scheduled for 12/6 at 1:00 PM and F/U appointment with Dr. Harrington Challenger was changed to 12/6 at 2:40 PM.   Daughter is asking if the patient is cleared to start driving again. She states that the patient has not had any syncopal episodes. Made daughter aware that there should be no reason why she should not be able to drive unless someone has advised her not to and if that is the case she should get clearance from them. She states that no one has advised her not to drive. She wants to hear specifically from Dr. Harrington Challenger on this. Made her aware that the information would be forwarded to Dr. Harrington Challenger for review.

## 2017-06-21 ENCOUNTER — Ambulatory Visit: Payer: Medicare Other | Admitting: Physician Assistant

## 2017-06-21 NOTE — Progress Notes (Signed)
Reviewed, agree 

## 2017-06-22 NOTE — Telephone Encounter (Signed)
Called patient's daughter back (DPR) and advised from heart standpoint patient is OK to drive per Dr. Harrington Challenger.

## 2017-06-22 NOTE — Telephone Encounter (Signed)
Pt  OK to drive from cardiac standpoint

## 2017-07-02 ENCOUNTER — Telehealth: Payer: Self-pay | Admitting: Internal Medicine

## 2017-07-02 NOTE — Telephone Encounter (Signed)
Note from [facility] in Dr. Alan Ripper folder to be addressed.  Physical therapist requesting eval and treat.  Pt's daughter stated patient was told she should not exercise so if there would be any restrictions they need specified. She requests this be signed by Dr. Harrington Challenger and faxed back to [facility] ASAP so that patient doesn't lose a whole week of possible therapy visits.

## 2017-07-02 NOTE — Telephone Encounter (Signed)
New message     Daughter is following up on the physical therapy , the paperwork has not been sent back to [facility] , it has been 4 weeks and paper work has not been sent back

## 2017-07-03 ENCOUNTER — Telehealth: Payer: Self-pay | Admitting: Internal Medicine

## 2017-07-03 NOTE — Telephone Encounter (Signed)
To Dr. Harrington Challenger to determine PT restrictions for patient.

## 2017-07-03 NOTE — Telephone Encounter (Signed)
Informed Jim, PT, that Dr. Harrington Challenger does not want the patient doing PT at all unless it involves just walking and doing regular daily activities. He was grateful for call and agrees with treatment plan.

## 2017-07-03 NOTE — Telephone Encounter (Signed)
I will fill out forms For now I want her doing daily activities  She is still recovering from pericarditis,  I do not want her starting physical therapy unless it involves just walking

## 2017-07-03 NOTE — Telephone Encounter (Signed)
New message     PT - does patient having any restrictions for physical therapy? In hospital patient was told no going to gym or doing housework ?

## 2017-07-09 ENCOUNTER — Telehealth: Payer: Self-pay | Admitting: Internal Medicine

## 2017-07-09 NOTE — Telephone Encounter (Signed)
Anderson Malta (patient daughter) calling, states that she called earlier about patient having therapy. Anderson Malta states that therapist needs more information in order to start therapy. Please call Anderson Malta.

## 2017-07-10 ENCOUNTER — Ambulatory Visit: Payer: Medicare Other | Admitting: Physician Assistant

## 2017-07-10 NOTE — Telephone Encounter (Signed)
Left message for Chelsea Taylor, PT with [facility] [phone] to call back to clarify what additional information is needed.

## 2017-07-10 NOTE — Telephone Encounter (Signed)
Incoming call from [place] from [facility]. He discussed that there were no restrictions on activity from PCP but per cardiology pt was restricted to light walking and daily activities only.  With this level of activity, Clair Gulling has placed his therapy on hold until she has less restriction.     Fay Records, MD  07/03/17 3:13 PM   I will fill out forms For now I want her doing daily activities  She is still recovering from pericarditis,  I do not want her starting physical therapy unless it involves just walking        Advised that patient is scheduled for follow up echo this Thursday and then sees Dr. Harrington Challenger same day.  He has requested that I fax him an update to [phone].  He would like to start balance exercises, which is considered moderate exertion.  HR restrictions would need to be included.  He asked that I contact patient's daughter, Anderson Malta and provide this information. I did speak with Anderson Malta and explained.  She understands that we will have an update on activity level after echo and appt this Thursday.

## 2017-07-12 ENCOUNTER — Other Ambulatory Visit: Payer: Self-pay

## 2017-07-12 ENCOUNTER — Ambulatory Visit (HOSPITAL_COMMUNITY): Payer: Medicare Other | Attending: Cardiovascular Disease

## 2017-07-12 ENCOUNTER — Ambulatory Visit: Payer: Medicare Other | Admitting: Internal Medicine

## 2017-07-12 ENCOUNTER — Encounter: Payer: Self-pay | Admitting: Internal Medicine

## 2017-07-12 VITALS — BP 138/58 | HR 102 | Ht 66.0 in | Wt 178.8 lb

## 2017-07-12 DIAGNOSIS — I313 Pericardial effusion (noninflammatory): Secondary | ICD-10-CM

## 2017-07-12 DIAGNOSIS — I10 Essential (primary) hypertension: Secondary | ICD-10-CM

## 2017-07-12 DIAGNOSIS — E039 Hypothyroidism, unspecified: Secondary | ICD-10-CM

## 2017-07-12 DIAGNOSIS — K7689 Other specified diseases of liver: Secondary | ICD-10-CM | POA: Diagnosis not present

## 2017-07-12 DIAGNOSIS — I3139 Other pericardial effusion (noninflammatory): Secondary | ICD-10-CM

## 2017-07-12 DIAGNOSIS — Z8249 Family history of ischemic heart disease and other diseases of the circulatory system: Secondary | ICD-10-CM | POA: Insufficient documentation

## 2017-07-12 DIAGNOSIS — E785 Hyperlipidemia, unspecified: Secondary | ICD-10-CM | POA: Diagnosis not present

## 2017-07-12 DIAGNOSIS — G4733 Obstructive sleep apnea (adult) (pediatric): Secondary | ICD-10-CM | POA: Diagnosis not present

## 2017-07-12 NOTE — Telephone Encounter (Signed)
Patient seen in cardiology today by Dr. Harrington Challenger after echo. Prescription given to patient to expand physical therapy to moderate activity.  No HR parameters. Copy of this prescription faxed to Herbert Deaner, PT with [facility] [phone].

## 2017-07-12 NOTE — Progress Notes (Signed)
Cardiology Office Note   Date:  07/12/2017   ID:  Chelsea Taylor, DOB [birth year], MRN [id number]  PCP:  Leighton Ruff, MD  Cardiologist:  New Dr. Harrington Challenger     Pt presents for f/u of pericarditis     History of Present Illness: Chelsea Taylor is a 76 y.o. female with hisotry of HTN, hypothyroidism, microscopic colitis  She presents for f/u of pericardial effusion/pericarditis    The pt was admitted to [facility] earlier this fall for CP  FOund to have a moderate pericardial effuison.  CT did show evid of CAD  Underwent thoracentesis at that time for pleural effusion  Episode felt to be viral She was treated with colchicine  Since she was seen she denies CP   Says her breathing is OK  Did say she feels tired     Past Medical History:  Diagnosis Date  . Collagenous colitis   . Degenerative arthritis    Knees  . Dyslipidemia   . Fibromyalgia   . Hypertension   . Hypothyroid   . Restless legs syndrome (RLS) 02/28/2013  . Sleep apnea     Past Surgical History:  Procedure Laterality Date  . APPENDECTOMY    . BACK SURGERY  2012  . BREAST BIOPSY Left    Fibroadenoma  . CATARACT EXTRACTION    . DILATION AND CURETTAGE OF UTERUS    . FOOT SURGERY Bilateral    multiple  . [place]  2014  . [place]  2007  . HYSTEROSCOPY    . TONSILLECTOMY       Current Outpatient Medications  Medication Sig Dispense Refill  . aspirin 81 MG tablet Take 81 mg by mouth at bedtime.     . calcium gluconate 500 MG tablet Take 500 mg by mouth 2 (two) times daily.     . Cholecalciferol (VITAMIN D) 2000 units CAPS Take 2,000 Units by mouth daily.     . CoenzymeQ10-Isoleucine-Glycine (CO Q-10) 100-50-25 MG TB24 Take 100 mg by mouth daily.    . colchicine 0.6 MG tablet Take 1 tablet (0.6 mg total) by mouth 2 (two) times daily. 60 tablet 1  . fish oil-omega-3 fatty acids 1000 MG capsule Take 2 g by mouth 2 (two) times daily.     . folic acid (FOLVITE) 443 MCG tablet Take 800 mcg by  mouth at bedtime.     . Glucosamine HCl 1500 MG TABS Take 1,500 mg by mouth 2 (two) times daily.     Marland Kitchen losartan (COZAAR) 50 MG tablet Take 50 mg by mouth daily.    . magnesium oxide (MAG-OX) 400 MG tablet Take 400 mg by mouth daily.    . Multiple Vitamin (MULTIVITAMIN) tablet Take 1 tablet by mouth daily.    . Multiple Vitamins-Minerals (ICAPS MV PO) Take 1 tablet by mouth at bedtime.    . pantoprazole (PROTONIX) 40 MG tablet Take 1 tablet (40 mg total) by mouth daily at 12 noon. 30 tablet 0  . Red Yeast Rice 600 MG CAPS Take 600 mg by mouth 2 (two) times daily.    Marland Kitchen rOPINIRole (REQUIP) 1 MG tablet Take 2 mg by mouth. 2 mg  tablets in afternoon and 3 mg tablets at bedtime    . sulfaSALAzine (AZULFIDINE) 500 MG EC tablet Take 1,500 mg by mouth 2 (two) times daily.    Marland Kitchen SYNTHROID 137 MCG tablet Take 137 mcg by mouth daily.     . vitamin B-12 (CYANOCOBALAMIN) 100  MCG tablet Take 100 mcg by mouth at bedtime.    . vitamin C (ASCORBIC ACID) 500 MG tablet Take 500 mg daily by mouth.     No current facility-administered medications for this visit.     Allergies:   Adhesive [tape]; Codeine; Neosporin [neomycin-bacitracin zn-polymyx]; and Penicillins    Social History:  The patient  reports that  has never smoked. she has never used smokeless tobacco. She reports that she drinks alcohol. She reports that she does not use drugs.   Family History:  The patient's family history includes Cancer in her father; Colon cancer in her father; Diabetes in her daughter; Heart attack in her brother and mother; Heart disease in her brother and mother; Heart failure in her mother; Hyperlipidemia in her mother; Hypertension in her father.    ROS:  General:no colds or fevers, + weight changes according to wts below Skin:no rashes or ulcers HEENT:no blurred vision, no congestion CV:see HPI PUL:see HPI GI:no diarrhea constipation or melena, no indigestion GU:no hematuria, no dysuria MS:no joint pain, no  claudication Neuro:no syncope, no lightheadedness Endo:no diabetes, no thyroid disease  Wt Readings from Last 3 Encounters:  07/12/17 178 lb 12.8 oz (81.1 kg)  06/20/17 173 lb (78.5 kg)  06/18/17 174 lb 1.9 oz (79 kg)     PHYSICAL EXAM: VS:  BP (!) 138/58   Pulse (!) 102   Ht 5\' 6"  (1.676 m)   Wt 178 lb 12.8 oz (81.1 kg)   SpO2 96%   BMI 28.86 kg/m  , BMI Body mass index is 28.86 kg/m. General:Pleasant affect, NAD Skin:Warm and dry, brisk capillary refill HEENT:normocephalic, sclera clear, mucus membranes moist Neck:supple, no JVD, no bruits  Heart:S1S2 RRR without murmur, gallup, rub or click Lungs:clear without rales, rhonchi, or wheezes WNU:UVOZ, non tender, + BS, do not palpate liver spleen or masses Ext:Tr lower ext edema, 2+ pedal pulses, 2+ radial pulses Neuro:alert and oriented X 3, MAE, follows commands, + facial symmetry    EKG:  EKG is NOT ordered today.    Recent Labs: 05/31/2017: B Natriuretic Peptide 134.3; TSH 0.918 06/02/2017: ALT 24; BUN 13; Creatinine, Ser 0.74; Hemoglobin 10.1; Magnesium 1.9; Platelets 270; Potassium 4.3; Sodium 137    Lipid Panel No results found for: CHOL, TRIG, HDL, CHOLHDL, VLDL, LDLCALC, LDLDIRECT     Other studies Reviewed: Ad Radiology/Studies:  Ct Angio Chest Pe W And/or Wo Contrast  Result Date: 05/31/2017 CLINICAL DATA:  Left chest pain. EXAM: CT ANGIOGRAPHY CHEST WITH CONTRAST TECHNIQUE: Multidetector CT imaging of the chest was performed using the standard protocol during bolus administration of intravenous contrast. Multiplanar CT image reconstructions and MIPs were obtained to evaluate the vascular anatomy. CONTRAST:  60 cc Isovue 370 COMPARISON:  Chest radiographs obtained earlier today. FINDINGS: Cardiovascular: Normally opacified pulmonary arteries with no pulmonary arterial filling defects. Borderline enlarged heart. Pericardial effusion with a maximum thickness of 1.5 cm. Atheromatous arterial calcifications,  including the coronary arteries and thoracic aorta. Mediastinum/Nodes: No enlarged lymph nodes. Lungs/Pleura: Small to moderate-sized left pleural effusion. Mild left lower lobe atelectasis and minimal right lower lobe atelectasis. Mild peripheral bilateral lower lobe bullous changes. Upper Abdomen: Multiple liver cysts. Musculoskeletal: Thoracic spine degenerative changes. Review of the MIP images confirms the above findings. IMPRESSION: 1. No pulmonary emboli. 2. Small to moderate-sized left pleural effusion. 3. Small to moderate-sized pericardial effusion. 4. Mild left lower lobe atelectasis and minimal right lower lobe atelectasis. 5. Mild paraseptal emphysema. 6. Calcific coronary artery and aortic atherosclerosis. Aortic  Atherosclerosis (ICD10-I70.0) and Emphysema (ICD10-J43.9). Electronically Signed   By: Claudie Revering M.D.   On: 05/31/2017 20:44      ASSESSMENT AND PLAN:  1.  Pericardial effusion   Pt had echo today that showed no effusion  Filling pattern of the heart appeared normal I would recomm staying on colchicine for now for a few more moths to avoid rebound With fatigue will get CBC,   Also check ANA, CRP, BNP  2. HTN controlled  3..  Hypothyroid Continue synthroid    I encouraged her to increase her activity  OK for PT   Current medicines are reviewed with the patient today.  The patient Has no concerns regarding medicines.  The following changes have been made:  See above Labs/ tests ordered today include:see above  Disposition:   FU:  see above  Signed, Dorris Carnes, MD  07/12/2017 2:21 PM    [address], [place], [place] Phone: [phone]; Fax: [phone]

## 2017-07-12 NOTE — Patient Instructions (Signed)
Your physician recommends that you continue on your current medications as directed. Please refer to the Current Medication list given to you today.  Your physician recommends that you return for lab work today (CBC w/differential, CRP, ANA, BNP, BMET)  Your physician recommends that you schedule a follow-up appointment in: February WITH DR. Harrington Challenger.   INCLUDED IN YOUR PAPERWORK TODAY IS A PRESCRIPTION FOR YOUR PHYSICAL THERAPIST.  I WILL ALSO FAX A COPY TO HIS ATTENTION.

## 2017-07-13 LAB — CBC WITH DIFFERENTIAL
BASOS ABS: 0 10*3/uL (ref 0.0–0.2)
Basos: 0 %
EOS (ABSOLUTE): 0.2 10*3/uL (ref 0.0–0.4)
Eos: 3 %
Hematocrit: 37.6 % (ref 34.0–46.6)
Hemoglobin: 12.2 g/dL (ref 11.1–15.9)
Immature Grans (Abs): 0 10*3/uL (ref 0.0–0.1)
Immature Granulocytes: 0 %
LYMPHS: 30 %
Lymphocytes Absolute: 2.2 10*3/uL (ref 0.7–3.1)
MCH: 30 pg (ref 26.6–33.0)
MCHC: 32.4 g/dL (ref 31.5–35.7)
MCV: 93 fL (ref 79–97)
MONOS ABS: 0.6 10*3/uL (ref 0.1–0.9)
Monocytes: 9 %
NEUTROS ABS: 4.2 10*3/uL (ref 1.4–7.0)
NEUTROS PCT: 58 %
RBC: 4.06 x10E6/uL (ref 3.77–5.28)
RDW: 13.4 % (ref 12.3–15.4)
WBC: 7.4 10*3/uL (ref 3.4–10.8)

## 2017-07-13 LAB — BASIC METABOLIC PANEL
BUN / CREAT RATIO: 18 (ref 12–28)
BUN: 12 mg/dL (ref 8–27)
CO2: 26 mmol/L (ref 20–29)
CREATININE: 0.68 mg/dL (ref 0.57–1.00)
Calcium: 9.9 mg/dL (ref 8.7–10.3)
Chloride: 101 mmol/L (ref 96–106)
GFR calc Af Amer: 97 mL/min/{1.73_m2} (ref >59)
GFR, EST NON AFRICAN AMERICAN: 84 mL/min/{1.73_m2} (ref >59)
Glucose: 95 mg/dL (ref 65–99)
POTASSIUM: 4.1 mmol/L (ref 3.5–5.2)
Sodium: 141 mmol/L (ref 134–144)

## 2017-07-13 LAB — C-REACTIVE PROTEIN: CRP: 2.6 mg/L (ref 0.0–4.9)

## 2017-07-13 LAB — PRO B NATRIURETIC PEPTIDE: NT-Pro BNP: 88 pg/mL (ref 0–738)

## 2017-07-13 LAB — ANA: ANA: NEGATIVE

## 2017-07-17 ENCOUNTER — Other Ambulatory Visit: Payer: Self-pay | Admitting: Internal Medicine

## 2017-07-17 LAB — ACID FAST CULTURE WITH REFLEXED SENSITIVITIES (MYCOBACTERIA)

## 2017-07-17 LAB — ACID FAST CULTURE WITH REFLEXED SENSITIVITIES

## 2017-07-18 ENCOUNTER — Telehealth: Payer: Self-pay | Admitting: Internal Medicine

## 2017-07-18 NOTE — Telephone Encounter (Signed)
Laureen Ochs (physical Therapist) calling, wanted to provide BP readings:  07-18-17: 170/81 pulse 88  07-17-17 199/91 Pulse 94 07-17-17 154/70 Pulse 77  07-16-17 121/65 Puldr 102 07-16-17 144/75 Pulse 93  07-15-17: 164/91 Pulse 102 07-15-17 181/81 Pulse 95  If you have any questions, can you call Jim.

## 2017-07-18 NOTE — Telephone Encounter (Signed)
Pt was on Zebeta before admit this fall I would recomm add 2.5 (she was on 5 ) back and follow BP

## 2017-07-18 NOTE — Telephone Encounter (Signed)
Spoke w/ PT, he checked her HR and BP today when he saw her, his reading was similar to those listed.  Patient checks BP/HR twice daily.  She uses automated cuff.   Per therapist, readings few weeks back were less elevated, 150s/80s.  Will route to Dr. Harrington Challenger for any recommendations and then will call patient. Therapist will continue to check BPs when he goes in the home.

## 2017-07-19 ENCOUNTER — Other Ambulatory Visit: Payer: Self-pay | Admitting: *Deleted

## 2017-07-19 MED ORDER — COLCHICINE 0.6 MG PO TABS: 0.6000 mg | ORAL_TABLET | Freq: Two times a day (BID) | ORAL | 1 refills | Status: DC

## 2017-07-19 MED ORDER — BISOPROLOL FUMARATE 5 MG PO TABS: 2.5000 mg | ORAL_TABLET | Freq: Every day | ORAL | 6 refills | Status: DC

## 2017-07-19 NOTE — Telephone Encounter (Signed)
Patient will start back taking bisoprolol 2.5 mg once daily.  Will record BP and call or email update in few weeks.  Husband states that Dr. Harrington Challenger said she would refill colchicine to [facility].  Pt still has couple weeks worth of medication.  Advised I will send after reviewing with Dr. Harrington Challenger.

## 2017-07-19 NOTE — Telephone Encounter (Signed)
Per last ov note, Dr. Harrington Challenger recommends continuing colchicine few more months.  Ok to refill.

## 2017-07-19 NOTE — Telephone Encounter (Signed)
Patients daughter, Delsa Sale left a msg on the refill vm stating that her mother saw Dr Harrington Challenger last Thursday for an appt and an rx for colchicine was supposed to have been sent to the pharmacy, but they never received it. Okay to refill? Please advise. Thanks, MI

## 2017-07-20 ENCOUNTER — Telehealth: Payer: Self-pay | Admitting: Internal Medicine

## 2017-07-20 NOTE — Telephone Encounter (Signed)
Patient reports mild chest discomfort only on inspiration for 1 day. She states she has no congestion, no "stuffiness," no cold-like symptoms. She says the sensation is not painful, but she can "feel a little tightness" when she takes a deep breath. She is taking medications as instructed.   Spoke with Cecilie Kicks, NP. Per Mickel Baas, instructed patient to take some Aleve, limit salt, and schedule appointment next week.  Reiterated to her to go to ED if symptoms worsen prior to appointment. Offered patient appointment next week but she refused. Instead she will call early next week with an update.

## 2017-07-20 NOTE — Telephone Encounter (Signed)
Herbert Deaner PT ( [place] ) is calling because Confirming a change in medication , Was told to start a new medication Bisoprol Fum ( takes a half of Tablet a day ) Since last night patient reports mild aches in chest with deep inhalation and today BP  is 140/75 sitting and standing . Please call if you have any questions . Thanks

## 2017-07-26 ENCOUNTER — Telehealth: Payer: Self-pay | Admitting: Internal Medicine

## 2017-07-26 NOTE — Telephone Encounter (Signed)
New Message  Pt c/o of Chest Pain: STAT if CP now or developed within 24 hours  1. Are you having CP right now? No  2. Are you experiencing any other symptoms (ex. SOB, nausea, vomiting, sweating)? No   3. How long have you been experiencing CP? yesterday   4. Is your CP continuous or coming and going? Patty states it was more of an aching pain that goes from one side of the chest to the other side.   ?

## 2017-07-26 NOTE — Telephone Encounter (Signed)
Called patient and explained the home care nurse called with report of chest pain. Pt feels this was a musculoskeletal type pain, it went away without any intervention.  Last week when she experienced the pain she was instructed to take Aleve and this helped the pain.  She feels she is doing pretty well, continuing PT exercises at home.  Continues to take colchicine.  Advised to call office if any other concerns arise.

## 2017-07-26 NOTE — Telephone Encounter (Signed)
See encounter from 07/20/17.

## 2017-09-06 ENCOUNTER — Encounter: Payer: Self-pay | Admitting: Internal Medicine

## 2017-09-14 ENCOUNTER — Encounter: Payer: Self-pay | Admitting: Internal Medicine

## 2017-09-14 ENCOUNTER — Ambulatory Visit: Payer: Medicare Other | Admitting: Internal Medicine

## 2017-09-14 VITALS — BP 142/72 | HR 75 | Ht 66.0 in | Wt 180.0 lb

## 2017-09-14 DIAGNOSIS — E038 Other specified hypothyroidism: Secondary | ICD-10-CM | POA: Diagnosis not present

## 2017-09-14 DIAGNOSIS — I10 Essential (primary) hypertension: Secondary | ICD-10-CM | POA: Diagnosis not present

## 2017-09-14 DIAGNOSIS — I319 Disease of pericardium, unspecified: Secondary | ICD-10-CM | POA: Diagnosis not present

## 2017-09-14 LAB — C-REACTIVE PROTEIN: CRP: 47.4 mg/L — ABNORMAL HIGH (ref 0.0–4.9)

## 2017-09-14 MED ORDER — BISOPROLOL FUMARATE 5 MG PO TABS: 5.0000 mg | ORAL_TABLET | Freq: Every day | ORAL | 6 refills | Status: AC

## 2017-09-14 NOTE — Patient Instructions (Signed)
Your physician has recommended you make the following change in your medication:  1.) increase zebeta to 5 mg (whole tablet) daily  Your physician recommends that you return for lab work in: today (CRP).  Your physician has requested that you have an echocardiogram. Echocardiography is a painless test that uses sound waves to create images of your heart. It provides your doctor with information about the size and shape of your heart and how well your heart's chambers and valves are working. This procedure takes approximately one hour. There are no restrictions for this procedure.  Your physician recommends that you schedule a follow-up appointment in: May, 2019 with Dr. Harrington Challenger.

## 2017-09-14 NOTE — Progress Notes (Signed)
Cardiology Office Note   Date:  09/14/2017   ID:  Chelsea Taylor, DOB 1940-11-15, MRN [id number]  PCP:  Leighton Ruff, MD  Cardiologist:  New Dr. Harrington Challenger     Pt presents for f/u of pericarditis     History of Present Illness: Chelsea Taylor is a 77 y.o. female with hisotry of HTN, hypothyroidism, microscopic colitis  She presents for f/u of pericardial effusion/pericarditis    The pt was admitted to [facility] earlier this fall for CP  FOund to have a moderate pericardial effuison.  CT did show evid of CAD  Underwent thoracentesis at that time for pleural effusion  Episode felt to be viral She was treated with colchicine  Repeat echoes done   No pericardial effusion seen on echo in December    Recomm continued colchicine for awhile to allow inflammation to settle down    BP logs from home   110s to 160  ON average 130s     On Monday had chest pressure  It is pleuritic  Pain was worse with sneezing  Has persisted through week   Has had intermittent chest pressure  Comes and goes  Aleve helps  Had  Past Medical History:  Diagnosis Date  . Collagenous colitis   . Degenerative arthritis    Knees  . Dyslipidemia   . Fibromyalgia   . Hypertension   . Hypothyroid   . Restless legs syndrome (RLS) 02/28/2013  . Sleep apnea     Past Surgical History:  Procedure Laterality Date  . APPENDECTOMY    . BACK SURGERY  2012  . BREAST BIOPSY Left    Fibroadenoma  . CATARACT EXTRACTION    . DILATION AND CURETTAGE OF UTERUS    . FOOT SURGERY Bilateral    multiple  . [place]  2014  . [place]  2007  . HYSTEROSCOPY    . TONSILLECTOMY       Current Outpatient Medications  Medication Sig Dispense Refill  . aspirin 81 MG tablet Take 81 mg by mouth at bedtime.     . bisoprolol (ZEBETA) 5 MG tablet Take 0.5 tablets (2.5 mg total) by mouth daily. 30 tablet 6  . calcium gluconate 500 MG tablet Take 500 mg by mouth 2 (two) times daily.     . Cholecalciferol (VITAMIN D)  2000 units CAPS Take 2,000 Units by mouth daily.     . CoenzymeQ10-Isoleucine-Glycine (CO Q-10) 100-50-25 MG TB24 Take 100 mg by mouth daily.    . colchicine 0.6 MG tablet Take 1 tablet (0.6 mg total) by mouth 2 (two) times daily. 60 tablet 1  . fish oil-omega-3 fatty acids 1000 MG capsule Take 2 g by mouth 2 (two) times daily.     . folic acid (FOLVITE) 485 MCG tablet Take 800 mcg by mouth at bedtime.     . Glucosamine HCl 1500 MG TABS Take 1,500 mg by mouth 2 (two) times daily.     Marland Kitchen losartan (COZAAR) 50 MG tablet Take 50 mg by mouth daily.    . magnesium oxide (MAG-OX) 400 MG tablet Take 400 mg by mouth daily.    . Multiple Vitamin (MULTIVITAMIN) tablet Take 1 tablet by mouth daily.    . Multiple Vitamins-Minerals (ICAPS MV PO) Take 1 tablet by mouth at bedtime.    . Red Yeast Rice 600 MG CAPS Take 600 mg by mouth 2 (two) times daily.    Marland Kitchen rOPINIRole (REQUIP) 1 MG tablet Take 2  mg by mouth. 2 mg  tablets in afternoon and 3 mg tablets at bedtime    . sulfaSALAzine (AZULFIDINE) 500 MG EC tablet Take 1,500 mg by mouth 2 (two) times daily.    Marland Kitchen SYNTHROID 137 MCG tablet Take 137 mcg by mouth daily.     . vitamin B-12 (CYANOCOBALAMIN) 100 MCG tablet Take 100 mcg by mouth at bedtime.    . vitamin C (ASCORBIC ACID) 500 MG tablet Take 500 mg daily by mouth.     No current facility-administered medications for this visit.     Allergies:   Adhesive [tape]; Codeine; Neosporin [neomycin-bacitracin zn-polymyx]; and Penicillins    Social History:  The patient  reports that  has never smoked. she has never used smokeless tobacco. She reports that she drinks alcohol. She reports that she does not use drugs.   Family History:  The patient's family history includes Cancer in her father; Colon cancer in her father; Diabetes in her daughter; Heart attack in her brother and mother; Heart disease in her brother and mother; Heart failure in her mother; Hyperlipidemia in her mother; Hypertension in her father.     ROS:  General:no colds or fevers, + weight changes according to wts below Skin:no rashes or ulcers HEENT:no blurred vision, no congestion CV:see HPI PUL:see HPI GI:no diarrhea constipation or melena, no indigestion GU:no hematuria, no dysuria MS:no joint pain, no claudication Neuro:no syncope, no lightheadedness Endo:no diabetes, no thyroid disease  Wt Readings from Last 3 Encounters:  09/14/17 180 lb (81.6 kg)  07/12/17 178 lb 12.8 oz (81.1 kg)  06/20/17 173 lb (78.5 kg)     PHYSICAL EXAM: VS:  BP (!) 142/72   Pulse 75   Ht 5\' 6"  (1.676 m)   Wt 180 lb (81.6 kg)   SpO2 94%   BMI 29.05 kg/m  , BMI Body mass index is 29.05 kg/m. General:Pleasan in, NAD Skin:Warm and dry HEENT:normocephalic, sclera clear, mucus membranes moist Neck:supple, no JVD, no bruits  Heart:S1S2 RRR without murmur, gallup, No rub Lungs:clear without rales, rhonchi, or wheezes QQV:ZDGL, non tender, + BS, do not palpate liver spleen or masses Ext:Tr LE ext edema, 2+ pedal pulses, 2+ radial pulses Neuro:alert and oriented X 3, MAE, follows commands    EKG:  EKG is NOT ordered today.    Recent Labs: 05/31/2017: B Natriuretic Peptide 134.3; TSH 0.918 06/02/2017: ALT 24; Magnesium 1.9; Platelets 270 07/12/2017: BUN 12; Creatinine, Ser 0.68; Hemoglobin 12.2; NT-Pro BNP 88; Potassium 4.1; Sodium 141    Lipid Panel No results found for: CHOL, TRIG, HDL, CHOLHDL, VLDL, LDLCALC, LDLDIRECT     Other studies Reviewed: Ad Radiology/Studies:  Ct Angio Chest Pe W And/or Wo Contrast  Result Date: 05/31/2017 CLINICAL DATA:  Left chest pain. EXAM: CT ANGIOGRAPHY CHEST WITH CONTRAST TECHNIQUE: Multidetector CT imaging of the chest was performed using the standard protocol during bolus administration of intravenous contrast. Multiplanar CT image reconstructions and MIPs were obtained to evaluate the vascular anatomy. CONTRAST:  60 cc Isovue 370 COMPARISON:  Chest radiographs obtained earlier today.  FINDINGS: Cardiovascular: Normally opacified pulmonary arteries with no pulmonary arterial filling defects. Borderline enlarged heart. Pericardial effusion with a maximum thickness of 1.5 cm. Atheromatous arterial calcifications, including the coronary arteries and thoracic aorta. Mediastinum/Nodes: No enlarged lymph nodes. Lungs/Pleura: Small to moderate-sized left pleural effusion. Mild left lower lobe atelectasis and minimal right lower lobe atelectasis. Mild peripheral bilateral lower lobe bullous changes. Upper Abdomen: Multiple liver cysts. Musculoskeletal: Thoracic spine degenerative changes. Review of  the MIP images confirms the above findings. IMPRESSION: 1. No pulmonary emboli. 2. Small to moderate-sized left pleural effusion. 3. Small to moderate-sized pericardial effusion. 4. Mild left lower lobe atelectasis and minimal right lower lobe atelectasis. 5. Mild paraseptal emphysema. 6. Calcific coronary artery and aortic atherosclerosis. Aortic Atherosclerosis (ICD10-I70.0) and Emphysema (ICD10-J43.9). Electronically Signed   By: Claudie Revering M.D.   On: 05/31/2017 20:44      ASSESSMENT AND PLAN:  1.  Pericardial effusion  Still with some mild discomfort  Monday a little worse  Aleve helps  Uses as needed  May have had reactivation  ? Triggered by some other viral exposure Keep on colchicine.  Take aleve 1 to 2 per day as needed   Will check CRP  Get limited echo (effusion, filling properties)  2. HTN BP is labile  Will increase Zebeta to 5 mg    3..  Hypothyroid Continue synthroid    F/U later this spring     Current medicines are reviewed with the patient today.  The patient Has no concerns regarding medicines.  The following changes have been made:  See above Labs/ tests ordered today include:see above  Disposition:   FU:  see above  Signed, Dorris Carnes, MD  09/14/2017 11:23 AM    [address], [place], [place] Phone: [phone]; Fax: [phone]

## 2017-09-16 ENCOUNTER — Other Ambulatory Visit: Payer: Self-pay | Admitting: Internal Medicine

## 2017-09-25 ENCOUNTER — Ambulatory Visit (HOSPITAL_COMMUNITY): Payer: Medicare Other | Attending: Cardiology

## 2017-09-25 ENCOUNTER — Other Ambulatory Visit: Payer: Self-pay

## 2017-09-25 DIAGNOSIS — I10 Essential (primary) hypertension: Secondary | ICD-10-CM | POA: Diagnosis not present

## 2017-09-25 DIAGNOSIS — I319 Disease of pericardium, unspecified: Secondary | ICD-10-CM | POA: Diagnosis not present

## 2017-09-25 DIAGNOSIS — E785 Hyperlipidemia, unspecified: Secondary | ICD-10-CM | POA: Insufficient documentation

## 2017-09-25 DIAGNOSIS — G4733 Obstructive sleep apnea (adult) (pediatric): Secondary | ICD-10-CM | POA: Insufficient documentation

## 2017-09-25 DIAGNOSIS — Z8249 Family history of ischemic heart disease and other diseases of the circulatory system: Secondary | ICD-10-CM | POA: Insufficient documentation

## 2017-10-03 ENCOUNTER — Encounter: Payer: Self-pay | Admitting: Pulmonary Disease

## 2017-10-03 ENCOUNTER — Ambulatory Visit: Payer: Medicare Other | Admitting: Pulmonary Disease

## 2017-10-03 VITALS — BP 124/66 | HR 67 | Ht 66.0 in | Wt 185.0 lb

## 2017-10-03 DIAGNOSIS — I3139 Other pericardial effusion (noninflammatory): Secondary | ICD-10-CM

## 2017-10-03 DIAGNOSIS — I313 Pericardial effusion (noninflammatory): Secondary | ICD-10-CM

## 2017-10-03 DIAGNOSIS — J90 Pleural effusion, not elsewhere classified: Secondary | ICD-10-CM | POA: Diagnosis not present

## 2017-10-03 NOTE — Progress Notes (Signed)
Subjective:    Patient ID: Chelsea Taylor, female    DOB: 10-12-1940, 77 y.o.   MRN: [id number]  Synopsis: Referred in 2018 for dyspnea after a recent pleural effusion.   HPI Chief Complaint  Patient presents with  . Follow-up    pt states she is doing well, denies any current breathing complaints.     Lelar has been OK.   No shortness of breath. She says that she had some pain in her chest once, but this went away. She says that the problem may have been muscular. She is still taking the colchicine.  No diarrhea from that.  Past Medical History:  Diagnosis Date  . Collagenous colitis   . Degenerative arthritis    Knees  . Dyslipidemia   . Fibromyalgia   . Hypertension   . Hypothyroid   . Restless legs syndrome (RLS) 02/28/2013  . Sleep apnea      Family History  Problem Relation Age of Onset  . Heart failure Mother   . Heart disease Mother   . Hyperlipidemia Mother   . Heart attack Mother   . Colon cancer Father   . Cancer Father   . Hypertension Father   . Heart attack Brother   . Heart disease Brother   . Diabetes Daughter      Social History   Socioeconomic History  . Marital status: Married    Spouse name: Not on file  . Number of children: 1  . Years of education: 15  . Highest education level: Not on file  Social Needs  . Financial resource strain: Not on file  . Food insecurity - worry: Not on file  . Food insecurity - inability: Not on file  . Transportation needs - medical: Not on file  . Transportation needs - non-medical: Not on file  Occupational History  . Occupation: Retired  Tobacco Use  . Smoking status: Never Smoker  . Smokeless tobacco: Never Used  Substance and Sexual Activity  . Alcohol use: Yes    Comment: Consumes alcohol twice per year  . Drug use: No  . Sexual activity: Not on file  Other Topics Concern  . Not on file  Social History Narrative  . Not on file     Allergies  Allergen Reactions  . Adhesive [Tape]     . Codeine   . Neosporin [Neomycin-Bacitracin Zn-Polymyx]   . Penicillins Rash    Has patient had a PCN reaction causing immediate rash, facial/tongue/throat swelling, SOB or lightheadedness with hypotension: No Has patient had a PCN reaction causing severe rash involving mucus membranes or skin necrosis: No Has patient had a PCN reaction that required hospitalization: No Has patient had a PCN reaction occurring within the last 10 years: No If all of the above answers are "NO", then may proceed with Cephalosporin use.     Outpatient Medications Prior to Visit  Medication Sig Dispense Refill  . aspirin 81 MG tablet Take 81 mg by mouth at bedtime.     . bisoprolol (ZEBETA) 5 MG tablet Take 1 tablet (5 mg total) by mouth daily. 30 tablet 6  . calcium gluconate 500 MG tablet Take 500 mg by mouth 2 (two) times daily.     . Cholecalciferol (VITAMIN D) 2000 units CAPS Take 2,000 Units by mouth daily.     . CoenzymeQ10-Isoleucine-Glycine (CO Q-10) 100-50-25 MG TB24 Take 100 mg by mouth daily.    . colchicine 0.6 MG tablet TAKE 1 TABLET(0.6 MG) BY  MOUTH TWICE DAILY 60 tablet 2  . fish oil-omega-3 fatty acids 1000 MG capsule Take 2 g by mouth 2 (two) times daily.     . folic acid (FOLVITE) 295 MCG tablet Take 800 mcg by mouth at bedtime.     . Glucosamine HCl 1500 MG TABS Take 1,500 mg by mouth 2 (two) times daily.     Marland Kitchen levothyroxine (SYNTHROID, LEVOTHROID) 125 MCG tablet Take 125 mcg by mouth daily before breakfast.    . losartan (COZAAR) 50 MG tablet Take 50 mg by mouth daily.    . magnesium oxide (MAG-OX) 400 MG tablet Take 400 mg by mouth daily.    . Multiple Vitamin (MULTIVITAMIN) tablet Take 1 tablet by mouth daily.    . Multiple Vitamins-Minerals (ICAPS MV PO) Take 1 tablet by mouth at bedtime.    . naproxen sodium (ALEVE) 220 MG tablet Take 220 mg by mouth 2 (two) times daily with a meal.    . Red Yeast Rice 600 MG CAPS Take 600 mg by mouth 2 (two) times daily.    Marland Kitchen rOPINIRole (REQUIP) 1  MG tablet Take 2 mg by mouth. 2 mg  tablets in afternoon and 3 mg tablets at bedtime    . sulfaSALAzine (AZULFIDINE) 500 MG EC tablet Take 1,500 mg by mouth 2 (two) times daily.    . vitamin B-12 (CYANOCOBALAMIN) 100 MCG tablet Take 100 mcg by mouth at bedtime.    . vitamin C (ASCORBIC ACID) 500 MG tablet Take 500 mg daily by mouth.    . SYNTHROID 137 MCG tablet Take 137 mcg by mouth daily.      No facility-administered medications prior to visit.       Review of Systems  Constitutional: Positive for fatigue. Negative for fever and unexpected weight change.  HENT: Negative for congestion, dental problem, ear pain, nosebleeds, postnasal drip, rhinorrhea, sinus pressure, sneezing, sore throat and trouble swallowing.   Eyes: Negative for redness and itching.  Respiratory: Positive for chest tightness and shortness of breath. Negative for cough and wheezing.   Cardiovascular: Negative for palpitations and leg swelling.  Gastrointestinal: Negative for nausea and vomiting.  Genitourinary: Negative for dysuria.  Musculoskeletal: Negative for joint swelling.  Skin: Negative for rash.  Neurological: Negative for headaches.  Hematological: Does not bruise/bleed easily.  Psychiatric/Behavioral: Negative for dysphoric mood. The patient is not nervous/anxious.        Objective:   Physical Exam Vitals:   10/03/17 1001  BP: 124/66  Pulse: 67  SpO2: 96%  Weight: 185 lb (83.9 kg)  Height: 5\' 6"  (1.676 m)    Gen: well appearing HENT: OP clear, TM's clear, neck supple PULM: CTA B, normal percussion CV: RRR, no mgr, trace edema GI: BS+, soft, nontender Derm: no cyanosis or rash Psyche: normal mood and affect   CBC    Component Value Date/Time   WBC 7.4 07/12/2017 1451   WBC 8.3 06/02/2017 0657   RBC 4.06 07/12/2017 1451   RBC 3.21 (L) 06/02/2017 0657   HGB 12.2 07/12/2017 1451   HCT 37.6 07/12/2017 1451   PLT 270 06/02/2017 0657   MCV 93 07/12/2017 1451   MCH 30.0 07/12/2017  1451   MCH 31.5 06/02/2017 0657   MCHC 32.4 07/12/2017 1451   MCHC 33.1 06/02/2017 0657   RDW 13.4 07/12/2017 1451   LYMPHSABS 2.2 07/12/2017 1451   MONOABS 1.0 06/02/2017 0657   EOSABS 0.2 07/12/2017 1451   BASOSABS 0.0 07/12/2017 1451    BMET  Component Value Date/Time   NA 141 07/12/2017 1451   K 4.1 07/12/2017 1451   CL 101 07/12/2017 1451   CO2 26 07/12/2017 1451   GLUCOSE 95 07/12/2017 1451   GLUCOSE 113 (H) 06/02/2017 0657   BUN 12 07/12/2017 1451   CREATININE 0.68 07/12/2017 1451   CALCIUM 9.9 07/12/2017 1451   GFRNONAA 84 07/12/2017 1451   GFRAA 97 07/12/2017 1451   Chest imaging: October 2018 CT chest images independently reviewed showing a pericardial effusion, small pleural effusion, atelectasis left base June 05, 2017 chest x-ray atelectasis left base slight pleural effusion  Pathology: October 2018 left pleural effusion negative     Assessment & Plan:    Pleural effusion  Pericardial effusion  Discussion: She has been doing well.  She has no recent respiratory complaints.  Her last chest x-ray showed resolution of the pleural effusion and today's physical exam shows no evidence of a pleural effusion.  Her most recent echocardiogram shows resolution of the pericardial effusion.  I suppose this was likely a viral process.  Plan: Pleural effusion: Resolved Let us know if you have problems with shortness of breath  Pericardial effusion: Resolved Continue colchicine as directed by Dr. Harrington Challenger Keep follow-up appointment with Dr. Harrington Challenger  We will see you back as needed   Current Outpatient Medications:  .  aspirin 81 MG tablet, Take 81 mg by mouth at bedtime. , Disp: , Rfl:  .  bisoprolol (ZEBETA) 5 MG tablet, Take 1 tablet (5 mg total) by mouth daily., Disp: 30 tablet, Rfl: 6 .  calcium gluconate 500 MG tablet, Take 500 mg by mouth 2 (two) times daily. , Disp: , Rfl:  .  Cholecalciferol (VITAMIN D) 2000 units CAPS, Take 2,000 Units by mouth daily. ,  Disp: , Rfl:  .  CoenzymeQ10-Isoleucine-Glycine (CO Q-10) 100-50-25 MG TB24, Take 100 mg by mouth daily., Disp: , Rfl:  .  colchicine 0.6 MG tablet, TAKE 1 TABLET(0.6 MG) BY MOUTH TWICE DAILY, Disp: 60 tablet, Rfl: 2 .  fish oil-omega-3 fatty acids 1000 MG capsule, Take 2 g by mouth 2 (two) times daily. , Disp: , Rfl:  .  folic acid (FOLVITE) 425 MCG tablet, Take 800 mcg by mouth at bedtime. , Disp: , Rfl:  .  Glucosamine HCl 1500 MG TABS, Take 1,500 mg by mouth 2 (two) times daily. , Disp: , Rfl:  .  levothyroxine (SYNTHROID, LEVOTHROID) 125 MCG tablet, Take 125 mcg by mouth daily before breakfast., Disp: , Rfl:  .  losartan (COZAAR) 50 MG tablet, Take 50 mg by mouth daily., Disp: , Rfl:  .  magnesium oxide (MAG-OX) 400 MG tablet, Take 400 mg by mouth daily., Disp: , Rfl:  .  Multiple Vitamin (MULTIVITAMIN) tablet, Take 1 tablet by mouth daily., Disp: , Rfl:  .  Multiple Vitamins-Minerals (ICAPS MV PO), Take 1 tablet by mouth at bedtime., Disp: , Rfl:  .  naproxen sodium (ALEVE) 220 MG tablet, Take 220 mg by mouth 2 (two) times daily with a meal., Disp: , Rfl:  .  Red Yeast Rice 600 MG CAPS, Take 600 mg by mouth 2 (two) times daily., Disp: , Rfl:  .  rOPINIRole (REQUIP) 1 MG tablet, Take 2 mg by mouth. 2 mg  tablets in afternoon and 3 mg tablets at bedtime, Disp: , Rfl:  .  sulfaSALAzine (AZULFIDINE) 500 MG EC tablet, Take 1,500 mg by mouth 2 (two) times daily., Disp: , Rfl:  .  vitamin B-12 (CYANOCOBALAMIN) 100 MCG tablet,  Take 100 mcg by mouth at bedtime., Disp: , Rfl:  .  vitamin C (ASCORBIC ACID) 500 MG tablet, Take 500 mg daily by mouth., Disp: , Rfl:

## 2017-10-03 NOTE — Patient Instructions (Signed)
Pleural effusion: Resolved Let us know if you have problems with shortness of breath  Pericardial effusion: Resolved Continue colchicine as directed by Dr. Harrington Challenger Keep follow-up appointment with Dr. Harrington Challenger  We will see you back as needed

## 2017-10-26 ENCOUNTER — Telehealth: Payer: Self-pay | Admitting: Internal Medicine

## 2017-10-26 NOTE — Telephone Encounter (Signed)
Reviewed chart and do not see an indication for need for SBE.  Pt advised.  I will forward this to Dr Harrington Challenger for review.  Pt's appt is scheduled for 11/08/17.

## 2017-10-26 NOTE — Telephone Encounter (Signed)
New Message  Pt c/o medication issue:  1. Name of Medication: antibiotic  2. How are you currently taking this medication (dosage and times per day)? n/a  3. Are you having a reaction (difficulty breathing--STAT)? no  4. What is your medication issue? Pt states she is having some work done on her teeth and would like to know if she needs to take an antibiotic

## 2017-10-26 NOTE — Telephone Encounter (Signed)
Patient does not need antibiotics before dental work  Not related to what she had

## 2017-10-29 NOTE — Telephone Encounter (Signed)
Called patient and confirmed with her.  She is aware she does not need antibiotics prior to dental work.

## 2017-11-15 ENCOUNTER — Telehealth: Payer: Self-pay | Admitting: Internal Medicine

## 2017-11-15 MED ORDER — COLCHICINE 0.6 MG PO TABS: 0.6000 mg | ORAL_TABLET | Freq: Every day | ORAL | 2 refills | Status: DC

## 2017-11-15 NOTE — Telephone Encounter (Signed)
New Message   Pt c/o medication issue:  1. Name of Medication: colchicine 0.6 MG tablet  2. How are you currently taking this medication (dosage and times per day)? One tablet by mouth twice a day  3. Are you having a reaction (difficulty breathing--STAT)?    4. What is your medication issue? Patient would like to know should she continue to take this medication. She states that the reason why she has taken this medication has cleared. Please call to discuss.

## 2017-11-15 NOTE — Telephone Encounter (Signed)
Reviewed with Dr. Harrington Challenger.  Pt can go to once a day dosing of colchicine and stay on this for now.  Colchicine needs to be tapered slowly. Pt aware and in agreement.

## 2017-11-29 DIAGNOSIS — H9113 Presbycusis, bilateral: Secondary | ICD-10-CM | POA: Insufficient documentation

## 2017-11-29 DIAGNOSIS — H6123 Impacted cerumen, bilateral: Secondary | ICD-10-CM | POA: Insufficient documentation

## 2017-12-14 ENCOUNTER — Encounter: Payer: Self-pay | Admitting: Internal Medicine

## 2017-12-14 ENCOUNTER — Ambulatory Visit: Payer: Medicare Other | Admitting: Internal Medicine

## 2017-12-14 VITALS — BP 136/64 | HR 62 | Ht 66.0 in | Wt 185.6 lb

## 2017-12-14 DIAGNOSIS — I10 Essential (primary) hypertension: Secondary | ICD-10-CM | POA: Diagnosis not present

## 2017-12-14 DIAGNOSIS — I300 Acute nonspecific idiopathic pericarditis: Secondary | ICD-10-CM

## 2017-12-14 DIAGNOSIS — I2510 Atherosclerotic heart disease of native coronary artery without angina pectoris: Secondary | ICD-10-CM | POA: Diagnosis not present

## 2017-12-14 NOTE — Patient Instructions (Signed)
Your physician recommends that you continue on your current medications as directed. Please refer to the Current Medication list given to you today. Your physician wants you to follow-up in: 6 months with Dr. Ross.  You will receive a reminder letter in the mail two months in advance. If you don't receive a letter, please call our office to schedule the follow-up appointment.  

## 2017-12-14 NOTE — Progress Notes (Signed)
Cardiology Office Note   Date:  12/14/2017   ID:  Chelsea Taylor, DOB September 25, 1940, MRN [id number]  PCP:  Leighton Ruff, MD  Cardiologist:  New Dr. Harrington Challenger     Pt presents for f/u of pericarditis     History of Present Illness: Chelsea Taylor is a 77 y.o. female with hisotry of HTN, hypothyroidism, microscopic colitis  She also has a history of  pericardial effusion/pericarditis    The pt was admitted to [facility] in Oct 2018 for  CP  FOund to have a moderate pericardial effuison.  CT did show evid of CAD  Underwent thoracentesis at that time for pleural effusion  Episode felt to be viral She was treated with colchicine  Repeat echoes done   No pericardial effusion seen on echo in December    Recomm continued colchicine for awhile to allow inflammation to settle down    I saw her earlier this spring when she had some CP  SInce I saw her she has done well   Denies CP   Breathing is OK   Stays active   No dizziness   Past Medical History:  Diagnosis Date  . Collagenous colitis   . Degenerative arthritis    Knees  . Dyslipidemia   . Fibromyalgia   . Hypertension   . Hypothyroid   . Restless legs syndrome (RLS) 02/28/2013  . Sleep apnea     Past Surgical History:  Procedure Laterality Date  . APPENDECTOMY    . BACK SURGERY  2012  . BREAST BIOPSY Left    Fibroadenoma  . CATARACT EXTRACTION    . DILATION AND CURETTAGE OF UTERUS    . FOOT SURGERY Bilateral    multiple  . [place]  2014  . [place]  2007  . HYSTEROSCOPY    . TONSILLECTOMY       Current Outpatient Medications  Medication Sig Dispense Refill  . aspirin 81 MG tablet Take 81 mg by mouth at bedtime.     . bisoprolol (ZEBETA) 5 MG tablet Take 1 tablet (5 mg total) by mouth daily. 30 tablet 6  . calcium gluconate 500 MG tablet Take 500 mg by mouth 2 (two) times daily.     . Cholecalciferol (VITAMIN D) 2000 units CAPS Take 2,000 Units by mouth daily.     . CoenzymeQ10-Isoleucine-Glycine (CO  Q-10) 100-50-25 MG TB24 Take 100 mg by mouth daily.    . colchicine 0.6 MG tablet Take 1 tablet (0.6 mg total) by mouth daily. 60 tablet 2  . fish oil-omega-3 fatty acids 1000 MG capsule Take 2 g by mouth 2 (two) times daily.     . folic acid (FOLVITE) 761 MCG tablet Take 800 mcg by mouth at bedtime.     . Glucosamine HCl 1500 MG TABS Take 1,500 mg by mouth 2 (two) times daily.     Marland Kitchen levothyroxine (SYNTHROID, LEVOTHROID) 125 MCG tablet Take 125 mcg by mouth daily before breakfast.    . losartan (COZAAR) 50 MG tablet Take 50 mg by mouth daily.    . magnesium oxide (MAG-OX) 400 MG tablet Take 400 mg by mouth daily.    . Multiple Vitamin (MULTIVITAMIN) tablet Take 1 tablet by mouth daily.    . Multiple Vitamins-Minerals (ICAPS MV PO) Take 1 tablet by mouth at bedtime.    . naproxen sodium (ALEVE) 220 MG tablet Take 220 mg by mouth 2 (two) times daily with a meal.    . Red  Yeast Rice 600 MG CAPS Take 600 mg by mouth 2 (two) times daily.    Marland Kitchen rOPINIRole (REQUIP) 1 MG tablet Take 2 mg by mouth. 2 mg  tablets in afternoon and 3 mg tablets at bedtime    . sulfaSALAzine (AZULFIDINE) 500 MG EC tablet Take 1,500 mg by mouth 2 (two) times daily.    . vitamin B-12 (CYANOCOBALAMIN) 100 MCG tablet Take 100 mcg by mouth at bedtime.    . vitamin C (ASCORBIC ACID) 500 MG tablet Take 500 mg daily by mouth.     No current facility-administered medications for this visit.     Allergies:   Adhesive [tape]; Codeine; Neosporin [neomycin-bacitracin zn-polymyx]; and Penicillins    Social History:  The patient  reports that she has never smoked. She has never used smokeless tobacco. She reports that she drinks alcohol. She reports that she does not use drugs.   Family History:  The patient's family history includes Cancer in her father; Colon cancer in her father; Diabetes in her daughter; Heart attack in her brother and mother; Heart disease in her brother and mother; Heart failure in her mother; Hyperlipidemia in  her mother; Hypertension in her father.    ROS:  General:no colds or fevers, + weight changes according to wts below Skin:no rashes or ulcers HEENT:no blurred vision, no congestion CV:see HPI PUL:see HPI GI:no diarrhea constipation or melena, no indigestion GU:no hematuria, no dysuria MS:no joint pain, no claudication Neuro:no syncope, no lightheadedness Endo:no diabetes, no thyroid disease  Wt Readings from Last 3 Encounters:  12/14/17 185 lb 9.6 oz (84.2 kg)  10/03/17 185 lb (83.9 kg)  09/14/17 180 lb (81.6 kg)     PHYSICAL EXAM: VS:  BP 136/64   Pulse 62   Ht 5\' 6"  (1.676 m)   Wt 185 lb 9.6 oz (84.2 kg)   SpO2 95%   BMI 29.96 kg/m  , BMI Body mass index is 29.96 kg/m. General:Pleasan in, NAD Skin:Warm and dry HEENT:normocephalic, sclera clear, mucus membranes moist Neck:supple  JVP is nromal   Heart:S1S2 RRR without murmur, gallup, No rub Lungs:clear without rales, rhonchi, or wheezes ENI:DPOE, non tender, + BS, do not palpate liver spleen or masses Ext:No LE edema , 2+ pedal pulses, 2+ radial pulses Neuro:alert and oriented X 3, MAE, follows commands    EKG:  EKG is not ordered today.    Recent Labs: 05/31/2017: B Natriuretic Peptide 134.3; TSH 0.918 06/02/2017: ALT 24; Magnesium 1.9; Platelets 270 07/12/2017: BUN 12; Creatinine, Ser 0.68; Hemoglobin 12.2; NT-Pro BNP 88; Potassium 4.1; Sodium 141    Lipid Panel No results found for: CHOL, TRIG, HDL, CHOLHDL, VLDL, LDLCALC, LDLDIRECT     Other studies Reviewed: Ad Radiology/Studies:  Ct Angio Chest Pe W And/or Wo Contrast  Result Date: 05/31/2017 CLINICAL DATA:  Left chest pain. EXAM: CT ANGIOGRAPHY CHEST WITH CONTRAST TECHNIQUE: Multidetector CT imaging of the chest was performed using the standard protocol during bolus administration of intravenous contrast. Multiplanar CT image reconstructions and MIPs were obtained to evaluate the vascular anatomy. CONTRAST:  60 cc Isovue 370 COMPARISON:  Chest  radiographs obtained earlier today. FINDINGS: Cardiovascular: Normally opacified pulmonary arteries with no pulmonary arterial filling defects. Borderline enlarged heart. Pericardial effusion with a maximum thickness of 1.5 cm. Atheromatous arterial calcifications, including the coronary arteries and thoracic aorta. Mediastinum/Nodes: No enlarged lymph nodes. Lungs/Pleura: Small to moderate-sized left pleural effusion. Mild left lower lobe atelectasis and minimal right lower lobe atelectasis. Mild peripheral bilateral lower lobe bullous changes.  Upper Abdomen: Multiple liver cysts. Musculoskeletal: Thoracic spine degenerative changes. Review of the MIP images confirms the above findings. IMPRESSION: 1. No pulmonary emboli. 2. Small to moderate-sized left pleural effusion. 3. Small to moderate-sized pericardial effusion. 4. Mild left lower lobe atelectasis and minimal right lower lobe atelectasis. 5. Mild paraseptal emphysema. 6. Calcific coronary artery and aortic atherosclerosis. Aortic Atherosclerosis (ICD10-I70.0) and Emphysema (ICD10-J43.9). Electronically Signed   By: Claudie Revering M.D.   On: 05/31/2017 20:44      ASSESSMENT AND PLAN:  1.  Pericardial effusion   Dong well   No CP   I would reocmm keeping on colchicine daily for a few more moths before iscontuing    2. HTN BP is good   Keep on same meds     3..  Hypothyroid Continue synthroid    4  HL  Stays on red yeast rice  F/U in 6 months     Current medicines are reviewed with the patient today.  The patient Has no concerns regarding medicines.  The following changes have been made:  See above Labs/ tests ordered today include:see above  Disposition:   FU:  see above  Signed, Dorris Carnes, MD  12/14/2017 11:32 AM    [address] [place], [place], [place], [place] Phone: [phone]; Fax: [phone]

## 2018-01-28 ENCOUNTER — Other Ambulatory Visit: Payer: Self-pay

## 2018-01-28 MED ORDER — COLCHICINE 0.6 MG PO TABS: 0.6000 mg | ORAL_TABLET | Freq: Every day | ORAL | 3 refills | Status: DC

## 2018-02-04 ENCOUNTER — Other Ambulatory Visit: Payer: Self-pay

## 2018-02-05 ENCOUNTER — Other Ambulatory Visit: Payer: Self-pay | Admitting: *Deleted

## 2018-02-05 MED ORDER — COLCHICINE 0.6 MG PO TABS: 0.6000 mg | ORAL_TABLET | Freq: Every day | ORAL | 0 refills | Status: DC

## 2018-02-05 NOTE — Telephone Encounter (Signed)
Patient called to follow up on request to refill colchicine. She stated that she was informed by [facility] that they reached out to our office over a week ago but never received a response. She went on to say that she contacted the office yesterday to request the refill but as of today the request hasn't been received and she is almost out of medication.  Patient stated that Dr Harrington Challenger informed her at her last visit, 12/14/17 that she would continue the medication for several months. She was instructed to follow up in six months. Patient aware that I will refill medication as requested. She verbalized her understanding and appreciation.  Per last office visit 12/14/17  ASSESSMENT AND PLAN:  1.  Pericardial effusion   Dong well   No CP   I would reocmm keeping on colchicine daily for a few more moths before iscontuing    2. HTN BP is good   Keep on same meds     3..  Hypothyroid Continue synthroid    4  HL  Stays on red yeast rice  F/U in 6 months     Current medicines are reviewed with the patient today.  The patient Has no concerns regarding medicines.  The following changes have been made:  See above Labs/ tests ordered today include:see above  Disposition:   FU:  see above  Signed, Dorris Carnes, MD  12/14/2017 11:32 AM    [place]

## 2018-03-26 ENCOUNTER — Other Ambulatory Visit: Payer: Self-pay | Admitting: Internal Medicine

## 2018-03-26 NOTE — Telephone Encounter (Signed)
der Providers   Prescribing Provider Encounter Provider  Fay Records, MD Juventino Slovak, CMA  Outpatient Medication Detail    Disp Refills Start End   colchicine 0.6 MG tablet 90 tablet 0 02/05/2018    Sig - Route: Take 1 tablet (0.6 mg total) by mouth daily. - Oral   Sent to pharmacy as: colchicine 0.6 MG tablet   E-Prescribing Status: Receipt confirmed by pharmacy (02/05/2018 9:11 AM EDT)   Pharmacy   [facility] [place], [place]

## 2018-07-19 ENCOUNTER — Encounter: Payer: Self-pay | Admitting: Internal Medicine

## 2018-07-19 ENCOUNTER — Ambulatory Visit: Payer: Medicare Other | Admitting: Internal Medicine

## 2018-07-19 VITALS — BP 140/58 | HR 68 | Ht 66.0 in | Wt 197.2 lb

## 2018-07-19 DIAGNOSIS — I10 Essential (primary) hypertension: Secondary | ICD-10-CM

## 2018-07-19 DIAGNOSIS — E782 Mixed hyperlipidemia: Secondary | ICD-10-CM

## 2018-07-19 DIAGNOSIS — E038 Other specified hypothyroidism: Secondary | ICD-10-CM | POA: Diagnosis not present

## 2018-07-19 DIAGNOSIS — I319 Disease of pericardium, unspecified: Secondary | ICD-10-CM | POA: Diagnosis not present

## 2018-07-19 NOTE — Progress Notes (Signed)
Cardiology Office Note   Date:  07/19/2018   ID:  Chelsea Taylor, DOB 09/29/1940, MRN [id number]  PCP:  Leighton Ruff, MD  Cardiologist:  New Dr. Harrington Challenger     Pt presents for f/u of pericarditis     History of Present Illness: Chelsea Taylor is a 77 y.o. female with hisotry of HTN, hypothyroidism, microscopic colitis  She also has a history of  pericardial effusion/pericarditis   The pt was admitted to [facility] in Oct 2018 for  CP  FOund to have a moderate pericardial effuison.  CT did show evid of CAD  Underwent thoracentesis at that time for pleural effusion  Episode felt to be viral She was treated with colchicine  Last echo in Feb 2019 no pericardial effusion     SInce seen she has done well from a cardiac standpoint   Denies CP   Breathing is short when she walks up tohe driveway   Not on flat ground     Denies dizziness   No syncope    Past Medical History:  Diagnosis Date  . Collagenous colitis   . Degenerative arthritis    Knees  . Dyslipidemia   . Fibromyalgia   . Hypertension   . Hypothyroid   . Restless legs syndrome (RLS) 02/28/2013  . Sleep apnea     Past Surgical History:  Procedure Laterality Date  . APPENDECTOMY    . BACK SURGERY  2012  . BREAST BIOPSY Left    Fibroadenoma  . CATARACT EXTRACTION    . DILATION AND CURETTAGE OF UTERUS    . FOOT SURGERY Bilateral    multiple  . [place]  2014  . [place]  2007  . HYSTEROSCOPY    . TONSILLECTOMY       Current Outpatient Medications  Medication Sig Dispense Refill  . aspirin 81 MG tablet Take 81 mg by mouth at bedtime.     . bisoprolol (ZEBETA) 5 MG tablet Take 1 tablet (5 mg total) by mouth daily. 30 tablet 6  . calcium gluconate 500 MG tablet Take 500 mg by mouth 2 (two) times daily.     . Cholecalciferol (VITAMIN D) 2000 units CAPS Take 2,000 Units by mouth daily.     . colchicine 0.6 MG tablet TAKE 1 TABLET BY MOUTH  DAILY 90 tablet 2  . folic acid (FOLVITE) 297 MCG tablet Take  800 mcg by mouth at bedtime.     Marland Kitchen levothyroxine (SYNTHROID, LEVOTHROID) 125 MCG tablet Take 125 mcg by mouth daily before breakfast.    . losartan (COZAAR) 50 MG tablet Take 50 mg by mouth daily.    . Multiple Vitamin (MULTIVITAMIN) tablet Take 1 tablet by mouth daily.    . naproxen sodium (ALEVE) 220 MG tablet Take 220 mg by mouth as needed.     . Red Yeast Rice 600 MG CAPS Take 600 mg by mouth 2 (two) times daily.    Marland Kitchen rOPINIRole (REQUIP) 1 MG tablet Take 2 mg by mouth. 2 mg  tablets in afternoon and 3 mg tablets at bedtime    . sulfaSALAzine (AZULFIDINE) 500 MG EC tablet Take 1,500 mg by mouth 2 (two) times daily.     No current facility-administered medications for this visit.     Allergies:   Adhesive [tape]; Codeine; Neosporin [neomycin-bacitracin zn-polymyx]; and Penicillins    Social History:  The patient  reports that she has never smoked. She has never used smokeless tobacco. She reports  current alcohol use. She reports that she does not use drugs.   Family History:  The patient's family history includes Cancer in her father; Colon cancer in her father; Diabetes in her daughter; Heart attack in her brother and mother; Heart disease in her brother and mother; Heart failure in her mother; Hyperlipidemia in her mother; Hypertension in her father.    ROS:  General:no colds or fevers, + weight changes according to wts below Skin:no rashes or ulcers HEENT:no blurred vision, no congestion CV:see HPI PUL:see HPI GI:no diarrhea constipation or melena, no indigestion GU:no hematuria, no dysuria MS:no joint pain, no claudication Neuro:no syncope, no lightheadedness Endo:no diabetes, no thyroid disease  Wt Readings from Last 3 Encounters:  07/19/18 197 lb 3.2 oz (89.4 kg)  12/14/17 185 lb 9.6 oz (84.2 kg)  10/03/17 185 lb (83.9 kg)     PHYSICAL EXAM: VS:  BP (!) 140/58   Pulse 68   Ht 5\' 6"  (1.676 m)   Wt 197 lb 3.2 oz (89.4 kg)   SpO2 95%   BMI 31.83 kg/m  , BMI Body mass  index is 31.83 kg/m. General:Pleasan in, NAD Skin:Warm and dry HEENT:normocephalic, sclera clear, mucus membranes moist Neck:supple  JVP is normal    Heart:S1S2 RRR without murmur, gallup, No rub Lungs:clear without rales, rhonchi, or wheezes KXF:GHWE, non tender, + BS, do not palpate liver spleen or masses Ext:No LE edema , 2+ pedal pulses, 2+ radial pulses Neuro:alert and oriented X 3, MAE, follows commands    EKG:  EKG is  ordered today.  SR 68  Septal MI       Recent Labs: No results found for requested labs within last 8760 hours.    Lipid Panel No results found for: CHOL, TRIG, HDL, CHOLHDL, VLDL, LDLCALC, LDLDIRECT     Other studies Reviewed: Ad Radiology/Studies:  Ct Angio Chest Pe W And/or Wo Contrast  Result Date: 05/31/2017 CLINICAL DATA:  Left chest pain. EXAM: CT ANGIOGRAPHY CHEST WITH CONTRAST TECHNIQUE: Multidetector CT imaging of the chest was performed using the standard protocol during bolus administration of intravenous contrast. Multiplanar CT image reconstructions and MIPs were obtained to evaluate the vascular anatomy. CONTRAST:  60 cc Isovue 370 COMPARISON:  Chest radiographs obtained earlier today. FINDINGS: Cardiovascular: Normally opacified pulmonary arteries with no pulmonary arterial filling defects. Borderline enlarged heart. Pericardial effusion with a maximum thickness of 1.5 cm. Atheromatous arterial calcifications, including the coronary arteries and thoracic aorta. Mediastinum/Nodes: No enlarged lymph nodes. Lungs/Pleura: Small to moderate-sized left pleural effusion. Mild left lower lobe atelectasis and minimal right lower lobe atelectasis. Mild peripheral bilateral lower lobe bullous changes. Upper Abdomen: Multiple liver cysts. Musculoskeletal: Thoracic spine degenerative changes. Review of the MIP images confirms the above findings. IMPRESSION: 1. No pulmonary emboli. 2. Small to moderate-sized left pleural effusion. 3. Small to moderate-sized  pericardial effusion. 4. Mild left lower lobe atelectasis and minimal right lower lobe atelectasis. 5. Mild paraseptal emphysema. 6. Calcific coronary artery and aortic atherosclerosis. Aortic Atherosclerosis (ICD10-I70.0) and Emphysema (ICD10-J43.9). Electronically Signed   By: Claudie Revering M.D.   On: 05/31/2017 20:44      ASSESSMENT AND PLAN:  1.  Pericardial effusion   Dong well   No CP  She is still taking colchicine   She can taper to daily then off     Call with recurrence of pain    2. HTN BP is sl increased but I would not push further   Keep on same meds  3..  Hypothyroid Continue synthroid    4  HL  Stays on red yeast rice  Will be available as needed      Signed, Dorris Carnes, MD  07/19/2018 12:15 PM    [place], [place], [place], [place] Phone: [phone]; Fax: [phone]

## 2018-07-19 NOTE — Patient Instructions (Signed)
Medication Instructions:  Your physician has recommended you make the following change in your medication:  1.) stop colchicine  If you need a refill on your cardiac medications before your next appointment, please call your pharmacy.   Lab work: none If you have labs (blood work) drawn today and your tests are completely normal, you will receive your results only by: Marland Kitchen MyChart Message (if you have MyChart) OR . A paper copy in the mail If you have any lab test that is abnormal or we need to change your treatment, we will call you to review the results.  Testing/Procedures: none  Follow-Up: Your physician recommends that you schedule a follow-up appointment as needed with Dr. Harrington Challenger.  Call if SOB, chest pain return.  Any Other Special Instructions Will Be Listed Below (If Applicable).

## 2018-07-23 ENCOUNTER — Ambulatory Visit: Payer: Medicare Other | Admitting: Sports Medicine

## 2018-09-03 ENCOUNTER — Encounter: Payer: Self-pay | Admitting: Sports Medicine

## 2018-09-03 ENCOUNTER — Ambulatory Visit: Payer: Medicare Other | Admitting: Sports Medicine

## 2018-09-03 VITALS — BP 150/75 | HR 73

## 2018-09-03 DIAGNOSIS — L909 Atrophic disorder of skin, unspecified: Secondary | ICD-10-CM

## 2018-09-03 DIAGNOSIS — M79675 Pain in left toe(s): Secondary | ICD-10-CM | POA: Diagnosis not present

## 2018-09-03 DIAGNOSIS — M216X9 Other acquired deformities of unspecified foot: Secondary | ICD-10-CM

## 2018-09-03 DIAGNOSIS — B351 Tinea unguium: Secondary | ICD-10-CM | POA: Diagnosis not present

## 2018-09-03 DIAGNOSIS — L84 Corns and callosities: Secondary | ICD-10-CM | POA: Diagnosis not present

## 2018-09-03 DIAGNOSIS — M79674 Pain in right toe(s): Secondary | ICD-10-CM | POA: Diagnosis not present

## 2018-09-03 NOTE — Progress Notes (Signed)
Subjective: Chelsea Taylor is a 78 y.o. female patient seen today in office with complaint of mildly painful thickened and elongated toenails and callus; unable to trim. Patient denies history of Diabetes, Neuropathy, or Vascular disease. Patient has no other pedal complaints at this time.   Review of Systems  All other systems reviewed and are negative.    Patient Active Problem List   Diagnosis Date Noted  . Bilateral impacted cerumen 11/29/2017  . Presbycusis of both ears 11/29/2017  . Pleural effusion 06/20/2017  . Microscopic colitis 06/01/2017  . Acute respiratory failure ([place]) 05/31/2017  . Essential hypertension 05/31/2017  . Hypothyroidism 05/31/2017  . Varicose veins of lower extremities with complications 09/64/3838  . Edema 06/03/2014  . Restless legs syndrome (RLS) 02/28/2013    Current Outpatient Medications on File Prior to Visit  Medication Sig Dispense Refill  . aspirin 81 MG tablet Take 81 mg by mouth at bedtime.     . bisoprolol (ZEBETA) 5 MG tablet Take 1 tablet (5 mg total) by mouth daily. 30 tablet 6  . calcium gluconate 500 MG tablet Take 500 mg by mouth 2 (two) times daily.     . Cholecalciferol (VITAMIN D) 2000 units CAPS Take 2,000 Units by mouth daily.     . folic acid (FOLVITE) 184 MCG tablet Take 800 mcg by mouth at bedtime.     Marland Kitchen levothyroxine (SYNTHROID, LEVOTHROID) 125 MCG tablet Take 125 mcg by mouth daily before breakfast.    . losartan (COZAAR) 50 MG tablet Take 50 mg by mouth daily.    . Multiple Vitamin (MULTIVITAMIN) tablet Take 1 tablet by mouth daily.    . naproxen sodium (ALEVE) 220 MG tablet Take 220 mg by mouth as needed.     . Red Yeast Rice 600 MG CAPS Take 600 mg by mouth 2 (two) times daily.    Marland Kitchen rOPINIRole (REQUIP) 1 MG tablet Take 2 mg by mouth. 2 mg  tablets in afternoon and 3 mg tablets at bedtime    . sulfaSALAzine (AZULFIDINE) 500 MG EC tablet Take 1,500 mg by mouth 2 (two) times daily.     No current facility-administered  medications on file prior to visit.     Allergies  Allergen Reactions  . Adhesive [Tape]   . Codeine   . Neosporin [Neomycin-Bacitracin Zn-Polymyx]   . Penicillins Rash    Has patient had a PCN reaction causing immediate rash, facial/tongue/throat swelling, SOB or lightheadedness with hypotension: No Has patient had a PCN reaction causing severe rash involving mucus membranes or skin necrosis: No Has patient had a PCN reaction that required hospitalization: No Has patient had a PCN reaction occurring within the last 10 years: No If all of the above answers are "NO", then may proceed with Cephalosporin use.    Objective: Physical Exam  General: Well developed, nourished, no acute distress, awake, alert and oriented x 3  Vascular: Dorsalis pedis artery 1/4 bilateral, Posterior tibial artery 1/4 bilateral, skin temperature warm to warm proximal to distal bilateral lower extremities, no varicosities, pedal hair present bilateral.  Neurological: Gross sensation present via light touch bilateral.   Dermatological: Skin is warm, dry, and supple bilateral, Nails 1-10 are tender, long, thick, and discolored with mild subungal debris, no webspace macerations present bilateral, no open lesions present bilateral, + hyperkeratotic tissue present bilateral plantar forefoot. No signs of infection bilateral.  Musculoskeletal: Asymptomatic fat pad atrophy and hammertoe boney deformities noted bilateral. Muscular strength within normal limits without painon range of  motion. No pain with calf compression bilateral.  Assessment and Plan:  Problem List Items Addressed This Visit    None    Visit Diagnoses    Pain due to onychomycosis of toenails of both feet    -  Primary   Callus       Atrophic plantar fat pad       Prominent metatarsal head, unspecified laterality          -Examined patient.  -Discussed treatment options for painful mycotic nails. -Mechanically debrided and reduced mycotic  nails with sterile nail nipper and dremel and parred callus skin x2 bilateral using chisel blade without incident. -Applied met pad bilateral  -Patient to return PRN or sooner if symptoms worsen.  Landis Martins, DPM

## 2018-09-26 ENCOUNTER — Telehealth: Payer: Self-pay | Admitting: Internal Medicine

## 2018-09-26 NOTE — Telephone Encounter (Signed)
New Message        Patient went to see her primary doctor Leighton Ruff) today and was told that she was going to send over her EKG to Dr. Harrington Challenger, and patient was wondering if Dr. Harrington Challenger had a chance to look at it and if so do she need to come in for an appointment?

## 2018-09-26 NOTE — Telephone Encounter (Signed)
Pt aware will forward message to Johnsie Cancel RN to follow up on EKG's for Dr Harrington Challenger to review .Adonis Housekeeper

## 2018-09-26 NOTE — Telephone Encounter (Signed)
Please have EKGs scanned

## 2018-09-30 NOTE — Telephone Encounter (Signed)
Advised patient that EKG has been reviewed by Dr. Harrington Challenger who states it is normal. Patient states Dr. Harrington Challenger told her in December to follow-up as needed and that she currently does not have any concerns. She will call back in the future as needed. She thanked me for the call.

## 2018-09-30 NOTE — Telephone Encounter (Signed)
I spoke to the patient who said that last Tuesday ( 2/18 ) she had an episode of CP 8/10 which lasted about 15 minutes.  Since then she has had one episode which lasted 1 minute.    She went to her PCP, Dr Drema Dallas last week and they did an EKG.  Since then they have faxed the EKG to [place] and it has been uploaded in Epic for Dr Harrington Challenger review.  The patient was wondering if she needs to come in for an appt. She is feeling fine today, but the EKG needs review.

## 2018-09-30 NOTE — Telephone Encounter (Signed)
Spoke to Dr Drema Dallas' office because we have not yet received the patient's EKG.  They said that they will try to refax it.

## 2018-10-08 ENCOUNTER — Ambulatory Visit: Payer: Medicare Other | Admitting: Sports Medicine

## 2018-10-08 ENCOUNTER — Encounter: Payer: Self-pay | Admitting: Sports Medicine

## 2018-10-08 DIAGNOSIS — M79671 Pain in right foot: Secondary | ICD-10-CM | POA: Diagnosis not present

## 2018-10-08 DIAGNOSIS — M216X9 Other acquired deformities of unspecified foot: Secondary | ICD-10-CM

## 2018-10-08 DIAGNOSIS — L84 Corns and callosities: Secondary | ICD-10-CM

## 2018-10-08 DIAGNOSIS — L909 Atrophic disorder of skin, unspecified: Secondary | ICD-10-CM

## 2018-10-08 DIAGNOSIS — M79672 Pain in left foot: Secondary | ICD-10-CM

## 2018-10-08 NOTE — Progress Notes (Signed)
Subjective: Chelsea Taylor is a 78 y.o. female patient seen today in office with complaint of pain to the ball of his foot secondary to callus states that the left callus is most painful and that the last treatment helped but now the pain is back.  Patient also requests for me to trim any nails that may be a little long at today's visit and reports that her nails grow very quickly.  Patient denies any other new health problems or pedal complaints at this time.   Patient Active Problem List   Diagnosis Date Noted  . Bilateral impacted cerumen 11/29/2017  . Presbycusis of both ears 11/29/2017  . Pleural effusion 06/20/2017  . Microscopic colitis 06/01/2017  . Acute respiratory failure ([place]) 05/31/2017  . Essential hypertension 05/31/2017  . Hypothyroidism 05/31/2017  . Varicose veins of lower extremities with complications 16/60/6301  . Edema 06/03/2014  . Restless legs syndrome (RLS) 02/28/2013    Current Outpatient Medications on File Prior to Visit  Medication Sig Dispense Refill  . aspirin 81 MG tablet Take 81 mg by mouth at bedtime.     . bisoprolol (ZEBETA) 5 MG tablet Take 1 tablet (5 mg total) by mouth daily. 30 tablet 6  . calcium gluconate 500 MG tablet Take 500 mg by mouth 2 (two) times daily.     . Cholecalciferol (VITAMIN D) 2000 units CAPS Take 2,000 Units by mouth daily.     . folic acid (FOLVITE) 601 MCG tablet Take 800 mcg by mouth at bedtime.     Marland Kitchen levothyroxine (SYNTHROID, LEVOTHROID) 125 MCG tablet Take 125 mcg by mouth daily before breakfast.    . losartan (COZAAR) 50 MG tablet Take 50 mg by mouth daily.    . Multiple Vitamin (MULTIVITAMIN) tablet Take 1 tablet by mouth daily.    . naproxen sodium (ALEVE) 220 MG tablet Take 220 mg by mouth as needed.     . Red Yeast Rice 600 MG CAPS Take 600 mg by mouth 2 (two) times daily.    Marland Kitchen rOPINIRole (REQUIP) 1 MG tablet Take 2 mg by mouth. 2 mg  tablets in afternoon and 3 mg tablets at bedtime    . sulfaSALAzine (AZULFIDINE)  500 MG EC tablet Take 1,500 mg by mouth 2 (two) times daily.     No current facility-administered medications on file prior to visit.     Allergies  Allergen Reactions  . Adhesive [Tape]   . Codeine   . Neosporin [Neomycin-Bacitracin Zn-Polymyx]   . Penicillins Rash    Has patient had a PCN reaction causing immediate rash, facial/tongue/throat swelling, SOB or lightheadedness with hypotension: No Has patient had a PCN reaction causing severe rash involving mucus membranes or skin necrosis: No Has patient had a PCN reaction that required hospitalization: No Has patient had a PCN reaction occurring within the last 10 years: No If all of the above answers are "NO", then may proceed with Cephalosporin use.    Objective: Physical Exam  General: Well developed, nourished, no acute distress, awake, alert and oriented x 3  Vascular: Dorsalis pedis artery 1/4 bilateral, Posterior tibial artery 1/4 bilateral, skin temperature warm to warm proximal to distal bilateral lower extremities, no varicosities, pedal hair present bilateral.  Neurological: Gross sensation present via light touch bilateral.   Dermatological: Skin is warm, dry, and supple bilateral, Nails 1-10 are short, thick, and discolored with mild subungal debris, no webspace macerations present bilateral, no open lesions present bilateral, + hyperkeratotic tissue present bilateral plantar  forefoot. No signs of infection bilateral.  Musculoskeletal: Asymptomatic fat pad atrophy and hammertoe boney deformities noted bilateral. Muscular strength within normal limits without painon range of motion. No pain with calf compression bilateral.  Assessment and Plan:  Problem List Items Addressed This Visit    None    Visit Diagnoses    Callus    -  Primary   Atrophic plantar fat pad       Prominent metatarsal head, unspecified laterality       Bilateral foot pain          -Examined patient.  -Discussed treatment options for painful  callus and long nails -Mechanically debrided and reduced mycotic nails with sterile nail nipper at no charge  -Mechanically parred callus skin x2 bilateral using chisel blade without incident at a charge  -Applied met pad bilateral to current shoe insole and advised patient that this will continue to recur due to foot deformity -Encourage patient to use skin creams and softening agents to help to decrease the buildup of callus -Patient to return PRN or sooner if symptoms worsen.  Landis Martins, DPM

## 2018-10-30 IMAGING — US US THORACENTESIS ASP PLEURAL SPACE W/IMG GUIDE
1 series · 5 of 5 positions shown · non-contrast
Comparison: None.

MEDICATIONS:
10 cc 1% lidocaine.

COMPLICATIONS:
None immediate.

INDICATION: Symptomatic left sided pleural effusion

EXAM:
US THORACENTESIS ASP PLEURAL SPACE W/IMG GUIDE
TECHNIQUE: Informed written consent was obtained from the patient after a
discussion of the risks, benefits and alternatives to treatment. A
timeout was performed prior to the initiation of the procedure.

[Series 1: us thoracentesis asp pleural space w/img guide · 0.28mm/px · 5 of 5 slices shown]
[im 1/5]
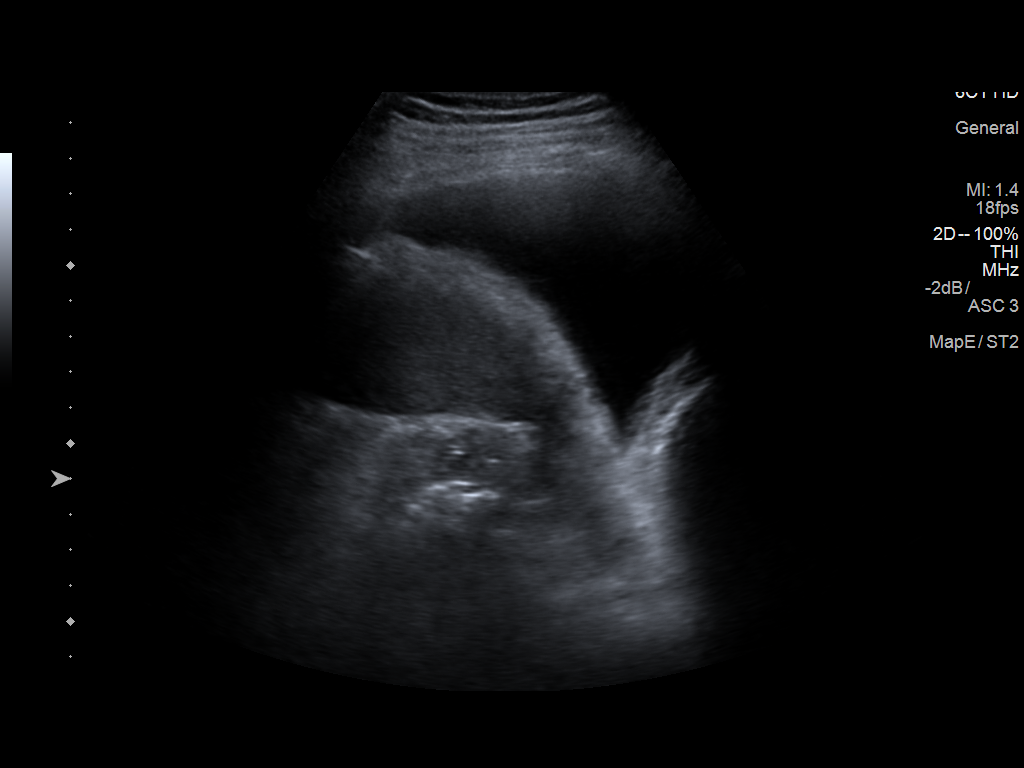
[im 2/5]
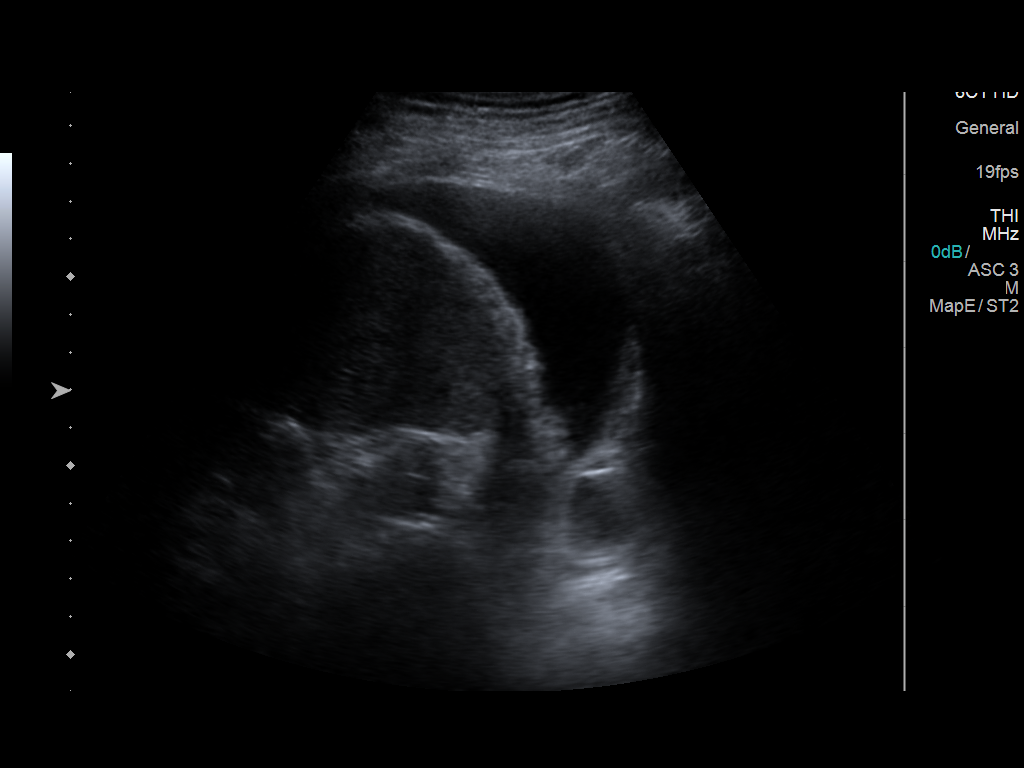
[im 3/5]
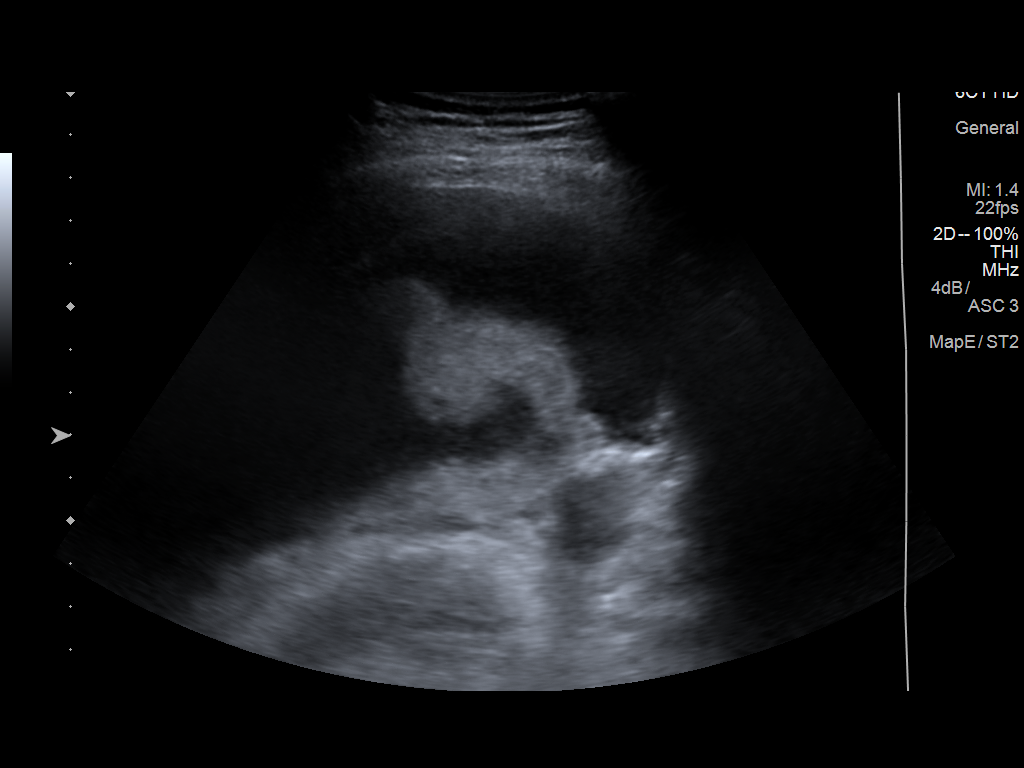
[im 4/5]
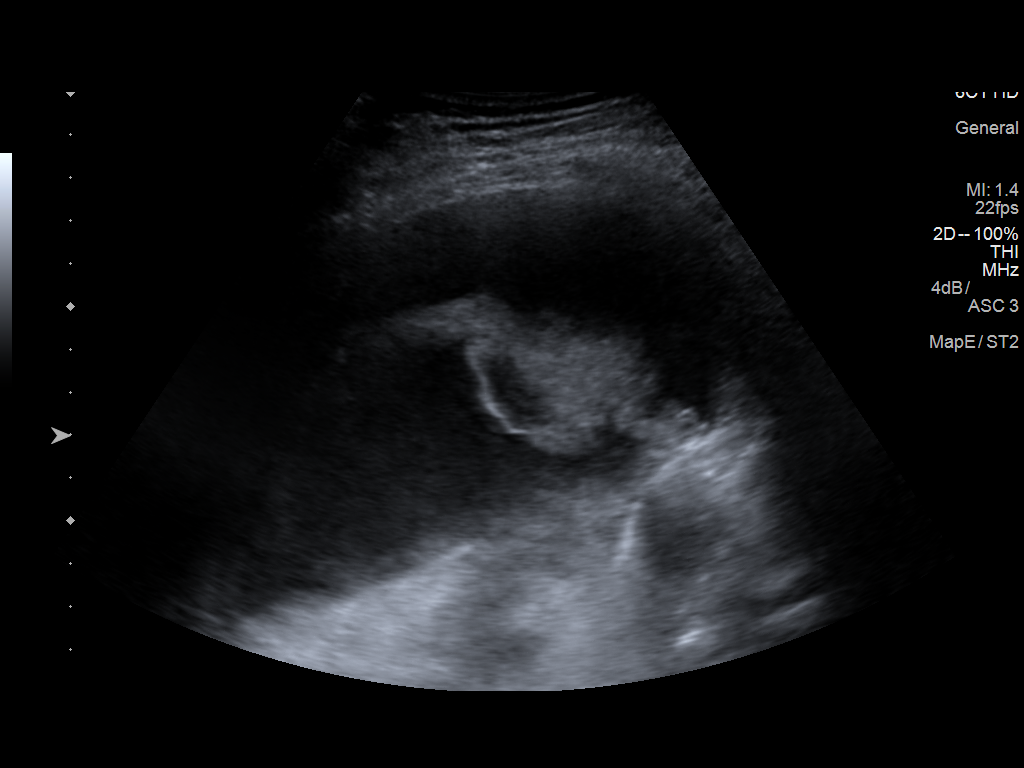
[im 5/5]
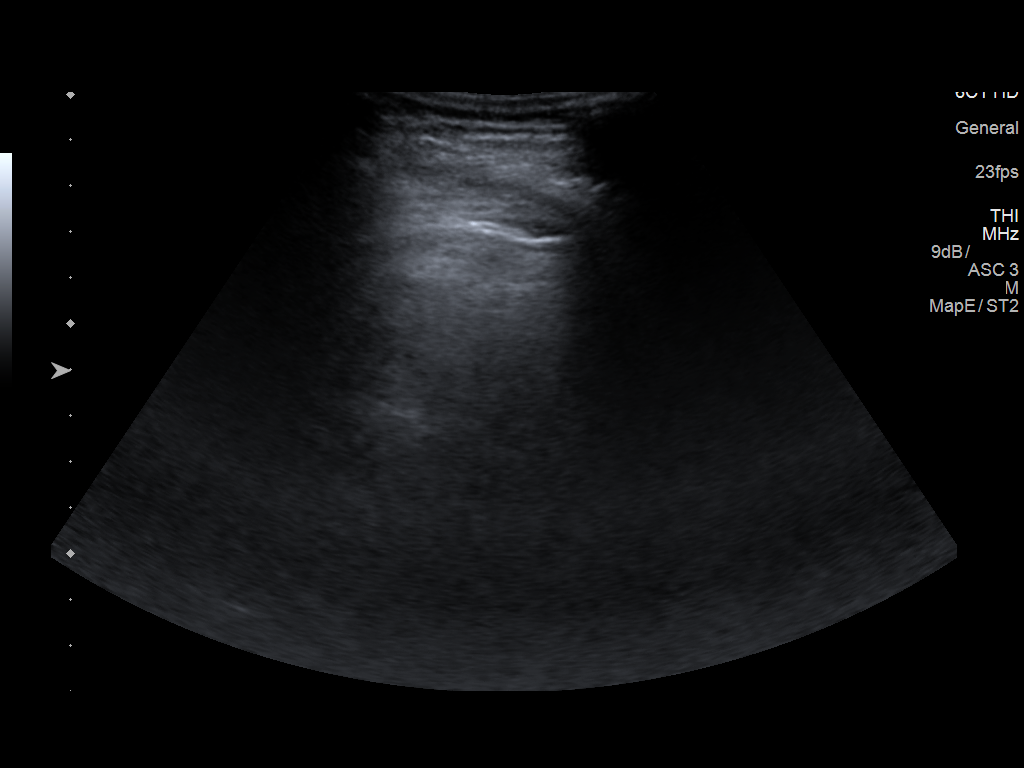

[5 of 5 positions shown; findings below may reference images not displayed]

Initial ultrasound scanning demonstrates a left pleural effusion.
The lower chest was prepped and draped in the usual sterile fashion.
1% lidocaine was used for local anesthesia.

Under direct ultrasound guidance, a 19 gauge, 7-cm, Yueh catheter
was introduced. An ultrasound image was saved for documentation
purposes. The thoracentesis was performed. The catheter was removed
and a dressing was applied. The patient tolerated the procedure well
without immediate post procedural complication. The patient was
escorted to have an upright chest radiograph.
FINDINGS: A total of approximately 480 cc of yellow fluid was removed.
Requested samples were sent to the laboratory.
IMPRESSION: Successful ultrasound-guided left sided thoracentesis yielding 480
cc of pleural fluid.

Follow-up chest radiograph shows no pneumothorax.

Read by

Istefany Pech

## 2018-12-10 ENCOUNTER — Ambulatory Visit: Payer: Medicare Other | Admitting: Sports Medicine

## 2019-01-17 ENCOUNTER — Encounter: Payer: Self-pay | Admitting: Internal Medicine

## 2019-02-04 ENCOUNTER — Encounter: Payer: Self-pay | Admitting: Sports Medicine

## 2019-02-04 ENCOUNTER — Ambulatory Visit: Payer: Medicare Other | Admitting: Sports Medicine

## 2019-02-04 ENCOUNTER — Other Ambulatory Visit: Payer: Self-pay

## 2019-02-04 VITALS — Temp 97.1°F

## 2019-02-04 DIAGNOSIS — L84 Corns and callosities: Secondary | ICD-10-CM | POA: Diagnosis not present

## 2019-02-04 DIAGNOSIS — M216X9 Other acquired deformities of unspecified foot: Secondary | ICD-10-CM | POA: Diagnosis not present

## 2019-02-04 DIAGNOSIS — L909 Atrophic disorder of skin, unspecified: Secondary | ICD-10-CM | POA: Diagnosis not present

## 2019-02-04 DIAGNOSIS — I739 Peripheral vascular disease, unspecified: Secondary | ICD-10-CM

## 2019-02-04 DIAGNOSIS — M2042 Other hammer toe(s) (acquired), left foot: Secondary | ICD-10-CM

## 2019-02-04 DIAGNOSIS — M2041 Other hammer toe(s) (acquired), right foot: Secondary | ICD-10-CM

## 2019-02-04 NOTE — Progress Notes (Signed)
Subjective: Chelsea Taylor is a 78 y.o. female patient seen today in office with complaint of pain to the ball of his foot secondary to callus on left and a new pain at toes R>L 5th with rubbing in shoes. Patient also requests for me to trim any nails that may be a little long at today's visit and reports that her nails grow very quickly like before.  Patient denies any other new health problems or pedal complaints at this time.   Patient Active Problem List   Diagnosis Date Noted  . Bilateral impacted cerumen 11/29/2017  . Presbycusis of both ears 11/29/2017  . Pleural effusion 06/20/2017  . Microscopic colitis 06/01/2017  . Acute respiratory failure ([place]) 05/31/2017  . Essential hypertension 05/31/2017  . Hypothyroidism 05/31/2017  . Varicose veins of lower extremities with complications 53/29/9242  . Edema 06/03/2014  . Restless legs syndrome (RLS) 02/28/2013    Current Outpatient Medications on File Prior to Visit  Medication Sig Dispense Refill  . aspirin 81 MG tablet Take 81 mg by mouth at bedtime.     . bisoprolol (ZEBETA) 5 MG tablet Take 1 tablet (5 mg total) by mouth daily. 30 tablet 6  . calcium gluconate 500 MG tablet Take 500 mg by mouth 2 (two) times daily.     . Cholecalciferol (VITAMIN D) 2000 units CAPS Take 2,000 Units by mouth daily.     . folic acid (FOLVITE) 683 MCG tablet Take 800 mcg by mouth at bedtime.     Marland Kitchen levothyroxine (SYNTHROID, LEVOTHROID) 125 MCG tablet Take 125 mcg by mouth daily before breakfast.    . losartan (COZAAR) 50 MG tablet Take 50 mg by mouth daily.    . Multiple Vitamin (MULTIVITAMIN) tablet Take 1 tablet by mouth daily.    . naproxen sodium (ALEVE) 220 MG tablet Take 220 mg by mouth as needed.     . Red Yeast Rice 600 MG CAPS Take 600 mg by mouth 2 (two) times daily.    Marland Kitchen rOPINIRole (REQUIP) 1 MG tablet Take 2 mg by mouth. 2 mg  tablets in afternoon and 3 mg tablets at bedtime    . sulfaSALAzine (AZULFIDINE) 500 MG EC tablet Take 1,500 mg by  mouth 2 (two) times daily.     No current facility-administered medications on file prior to visit.     Allergies  Allergen Reactions  . Adhesive [Tape]   . Codeine   . Neosporin [Neomycin-Bacitracin Zn-Polymyx]   . Penicillins Rash    Has patient had a PCN reaction causing immediate rash, facial/tongue/throat swelling, SOB or lightheadedness with hypotension: No Has patient had a PCN reaction causing severe rash involving mucus membranes or skin necrosis: No Has patient had a PCN reaction that required hospitalization: No Has patient had a PCN reaction occurring within the last 10 years: No If all of the above answers are "NO", then may proceed with Cephalosporin use.    Objective: Physical Exam  General: Well developed, nourished, no acute distress, awake, alert and oriented x 3  Vascular: Dorsalis pedis artery 1/4 bilateral, Posterior tibial artery 1/4 bilateral, skin temperature warm to warm proximal to distal bilateral lower extremities, no varicosities, pedal hair present bilateral.  Neurological: Gross sensation present via light touch bilateral.   Dermatological: Skin is warm, dry, and supple bilateral, Nails 1-10 are mildly elongated thick, and discolored with mild subungal debris, no webspace macerations present bilateral, no open lesions present bilateral, + hyperkeratotic tissue present plantar forefoot on left and dorsal  5th toe on right. No signs of infection bilateral.  Musculoskeletal: Asymptomatic fat pad atrophy and hammertoe boney deformities noted bilateral. Muscular strength within normal limits without pain on range of motion. No pain with calf compression bilateral.  Assessment and Plan:  Problem List Items Addressed This Visit    None    Visit Diagnoses    Callus    -  Primary   Atrophic plantar fat pad       Prominent metatarsal head, unspecified laterality       PVD (peripheral vascular disease) ([place])       Hammer toes of both feet           -Examined patient.  -Discussed treatment options for painful callus and long nails -Mechanically debrided and reduced mycotic nails with sterile nail nipper at no charge  -Mechanically parred callus skin x2 bilateral using chisel blade without incident at no charge since very minimal today -Applied met pad bilateral to current shoe insole and advised patient that this will continue to recur due to foot deformity like before and toe cap 5th toes -Encourage patient to use skin creams and softening agents to help to decrease the buildup of callus like before -Patient to return PRN or sooner if symptoms worsen.  Landis Martins, DPM

## 2019-03-14 ENCOUNTER — Telehealth: Payer: Self-pay | Admitting: Internal Medicine

## 2019-03-14 NOTE — Telephone Encounter (Signed)
Adding to message:  Patient is also wondering if she can wear compression stockings and socks. Please call back and advise.

## 2019-03-14 NOTE — Telephone Encounter (Signed)
Pt called to report that starting on Tuesday 03/11/19 her PCP Dr. Drema Dallas put her on Lasix 60mg  a day... she did have her on 40mg  a day prior.... she has been doing labwork to monitor her kidneys... she is still having mild ankle edema so instead of doing the 60mg  for only 3 days she has added a 4th day.... I have asked her to talk with Dr. Drema Dallas and to let her know that she is still on the increased dose and to see if she thinks she may need K supplement... pt to also keep her feet and legs elevated and to wear her compression stockings since she has not worn them... I also talked to her about reading food labels and being cautious how much NA intake in her diet and she verbalized understanding and agreed. Will call if she has any further problems and will also keep track of her BP.   Next OV with Dr. Harrington Challenger  03/24/19.

## 2019-03-14 NOTE — Telephone Encounter (Signed)
New Message   Patient went to see her PCP and she has had fluid and swelling in her feet and ankles. PCP has increased the Lasix that the patient takes from 2 pill per day to 3 pills per day. PCP instructed patient to call Dr. Harrington Challenger to inform her.     Pt c/o swelling: STAT is pt has developed SOB within 24 hours  1) How much weight have you gained and in what time span? Within 6 weeks gained 5 lbs  2) If swelling, where is the swelling located? Feet and Ankles  3) Are you currently taking a fluid pill? Yes  4) Are you currently SOB? No  5) Do you have a log of your daily weights (if so, list)? 8/7-194.0, 8/6-193.4, 8/5-193.2, 8/4-194.6, 8/3-195.4, 8/2-196.4  6) Have you gained 3 pounds in a day or 5 pounds in a week? No  7) Have you traveled recently? No

## 2019-03-23 NOTE — Progress Notes (Signed)
Cardiology Office Note   Date:  03/24/2019   ID:  MATTINGLY FOUNTAINE, DOB 04/30/1941, MRN [id number]  PCP:  Leighton Ruff, MD  Cardiologist:  New Dr. Harrington Challenger     Pt presents for f/u of pericarditis     History of Present Illness: RYIN SCHILLO is a 78 y.o. female with hisotry of HTN, hypothyroidism, microscopic colitis  She also has a history of  pericardial effusion/pericarditis   The pt was admitted to [facility] in Oct 2018 for  CP  FOund to have a moderate pericardial effuison.  CT did show evid of CAD  Underwent thoracentesis at that time for pleural effusion  Episode felt to be viral She was treated with colchicine  Last echo in Feb 2019 showed no pericardial effusion     I saw the patient in clinic in December 2019  At that time I recomm tapering off of colchicine  Since seen, she has now  developed LE swellling      On July 21  She developed severe pain on R side of chest that woke her up at 4 AM   [facility]  Patient said she didn't move   Episode lasted about 10 min No injury  Faded off   Has not had again  The pt denies SOB   NO dizziness   No PND    No change in diet She denies excess salt Past Medical History:  Diagnosis Date  . Collagenous colitis   . Degenerative arthritis    Knees  . Dyslipidemia   . Fibromyalgia   . Hypertension   . Hypothyroid   . Restless legs syndrome (RLS) 02/28/2013  . Sleep apnea     Past Surgical History:  Procedure Laterality Date  . APPENDECTOMY    . BACK SURGERY  2012  . BREAST BIOPSY Left    Fibroadenoma  . CATARACT EXTRACTION    . DILATION AND CURETTAGE OF UTERUS    . FOOT SURGERY Bilateral    multiple  . [place]  2014  . [place]  2007  . HYSTEROSCOPY    . TONSILLECTOMY       Current Outpatient Medications  Medication Sig Dispense Refill  . aspirin 81 MG tablet Take 81 mg by mouth at bedtime.     . bisoprolol (ZEBETA) 5 MG tablet Take 1 tablet (5 mg total) by mouth daily. 30 tablet 6  . calcium  gluconate 500 MG tablet Take 500 mg by mouth 2 (two) times daily.     . Cholecalciferol (VITAMIN D) 2000 units CAPS Take 2,000 Units by mouth daily.     . folic acid (FOLVITE) 409 MCG tablet Take 800 mcg by mouth at bedtime.     . furosemide (LASIX) 20 MG tablet Take 60 mg by mouth daily.    Marland Kitchen levothyroxine (SYNTHROID, LEVOTHROID) 125 MCG tablet Take 125 mcg by mouth daily before breakfast.    . losartan (COZAAR) 50 MG tablet Take 50 mg by mouth daily.    . Multiple Vitamin (MULTIVITAMIN) tablet Take 1 tablet by mouth daily.    . naproxen sodium (ALEVE) 220 MG tablet Take 220 mg by mouth as needed.     . Red Yeast Rice 600 MG CAPS Take 600 mg by mouth 2 (two) times daily.    Marland Kitchen rOPINIRole (REQUIP) 1 MG tablet Take 2 mg by mouth. 2 mg  tablets in afternoon and 3 mg tablets at bedtime    . sulfaSALAzine (AZULFIDINE) 500  MG EC tablet Take 1,500 mg by mouth 2 (two) times daily.     No current facility-administered medications for this visit.     Allergies:   Adhesive [tape], Codeine, Neosporin [neomycin-bacitracin zn-polymyx], and Penicillins    Social History:  The patient  reports that she has never smoked. She has never used smokeless tobacco. She reports current alcohol use. She reports that she does not use drugs.   Family History:  The patient's family history includes Cancer in her father; Colon cancer in her father; Diabetes in her daughter; Heart attack in her brother and mother; Heart disease in her brother and mother; Heart failure in her mother; Hyperlipidemia in her mother; Hypertension in her father.    ROS:  General:no colds or fevers, + weight changes according to wts below Skin:no rashes or ulcers HEENT:no blurred vision, no congestion CV:see HPI PUL:see HPI GI:no diarrhea constipation or melena, no indigestion GU:no hematuria, no dysuria MS:no joint pain, no claudication Neuro:no syncope, no lightheadedness Endo:no diabetes, no thyroid disease  Wt Readings from Last 3  Encounters:  03/24/19 198 lb 12.8 oz (90.2 kg)  07/19/18 197 lb 3.2 oz (89.4 kg)  12/14/17 185 lb 9.6 oz (84.2 kg)     PHYSICAL EXAM: VS:  BP (!) 122/56   Pulse 74   Ht 5\' 6"  (1.676 m)   Wt 198 lb 12.8 oz (90.2 kg)   SpO2 94%   BMI 32.09 kg/m  , BMI Body mass index is 32.09 kg/m.   General:Pleasant in, NAD Skin:Warm and dry HEENT:normocephalic, sclera clear, mucus membranes moist Neck:supple  JVP is mildly increased Heart:S1S2 RRR without murmur, gallup, No rub Lungs:clear without rales, rhonchi, or wheezes XQJ:JHER, non tender, + BS, do not palpate liver spleen or masses DEY:CXKGYJEHUD edema of legs   Neuro:alert and oriented X 3, MAE, follows commands    EKG  Not ordered      Recent Labs: No results found for requested labs within last 8760 hours.    Lipid Panel No results found for: CHOL, TRIG, HDL, CHOLHDL, VLDL, LDLCALC, LDLDIRECT    Impression  1  Edema   Pt with nonpiting edema that is new   Volume otherwise does not look too bad   Will check labs  (CBC, BMET, BNP)  WIll also check TSH   Set pt up for echo    Watch salt intake  2  Hx pericarditis   I am not convinced of chronic pericarditis or constriction from exam   WIll set up for echo though to reevaluate  3  Thyroid  Check TSH    4  HTN  Continue current meds  F/U based on test results  Signed, Dorris Carnes, MD  03/24/2019 3:15 PM    [address], [place], [place], [place] Phone: [phone]; Fax: [phone]

## 2019-03-24 ENCOUNTER — Ambulatory Visit (INDEPENDENT_AMBULATORY_CARE_PROVIDER_SITE_OTHER): Payer: Medicare Other | Admitting: Internal Medicine

## 2019-03-24 ENCOUNTER — Other Ambulatory Visit: Payer: Self-pay

## 2019-03-24 ENCOUNTER — Encounter: Payer: Self-pay | Admitting: Internal Medicine

## 2019-03-24 VITALS — BP 122/56 | HR 74 | Ht 66.0 in | Wt 198.8 lb

## 2019-03-24 DIAGNOSIS — E039 Hypothyroidism, unspecified: Secondary | ICD-10-CM

## 2019-03-24 DIAGNOSIS — M7989 Other specified soft tissue disorders: Secondary | ICD-10-CM

## 2019-03-24 DIAGNOSIS — I2510 Atherosclerotic heart disease of native coronary artery without angina pectoris: Secondary | ICD-10-CM

## 2019-03-24 NOTE — Patient Instructions (Signed)
Medication Instructions:  No changes today If you need a refill on your cardiac medications before your next appointment, please call your pharmacy.   Lab work: Today: cbc, bnp, bmet, tsh, crp  If you have labs (blood work) drawn today and your tests are completely normal, you will receive your results only by: Marland Kitchen MyChart Message (if you have MyChart) OR . A paper copy in the mail If you have any lab test that is abnormal or we need to change your treatment, we will call you to review the results.  Testing/Procedures: Your physician has requested that you have an echocardiogram. Echocardiography is a painless test that uses sound waves to create images of your heart. It provides your doctor with information about the size and shape of your heart and how well your heart's chambers and valves are working. This procedure takes approximately one hour. There are no restrictions for this procedure.   Follow-Up: Follow up with your physician will depend on test results.  Any Other Special Instructions Will Be Listed Below (If Applicable).

## 2019-03-25 LAB — CBC
Hematocrit: 36.6 % (ref 34.0–46.6)
Hemoglobin: 12.5 g/dL (ref 11.1–15.9)
MCH: 31.9 pg (ref 26.6–33.0)
MCHC: 34.2 g/dL (ref 31.5–35.7)
MCV: 93 fL (ref 79–97)
Platelets: 303 10*3/uL (ref 150–450)
RBC: 3.92 x10E6/uL (ref 3.77–5.28)
RDW: 12.2 % (ref 11.7–15.4)
WBC: 7.3 10*3/uL (ref 3.4–10.8)

## 2019-03-25 LAB — BASIC METABOLIC PANEL
BUN/Creatinine Ratio: 22 (ref 12–28)
BUN: 20 mg/dL (ref 8–27)
CO2: 28 mmol/L (ref 20–29)
Calcium: 9.6 mg/dL (ref 8.7–10.3)
Chloride: 99 mmol/L (ref 96–106)
Creatinine, Ser: 0.89 mg/dL (ref 0.57–1.00)
GFR calc Af Amer: 71 mL/min/{1.73_m2} (ref >59)
GFR calc non Af Amer: 61 mL/min/{1.73_m2} (ref >59)
Glucose: 112 mg/dL — ABNORMAL HIGH (ref 65–99)
Potassium: 4.2 mmol/L (ref 3.5–5.2)
Sodium: 141 mmol/L (ref 134–144)

## 2019-03-25 LAB — TSH: TSH: 2.22 u[IU]/mL (ref 0.450–4.500)

## 2019-03-25 LAB — C-REACTIVE PROTEIN: CRP: 5 mg/L (ref 0–10)

## 2019-03-25 LAB — PRO B NATRIURETIC PEPTIDE: NT-Pro BNP: 214 pg/mL (ref 0–738)

## 2019-03-28 ENCOUNTER — Other Ambulatory Visit: Payer: Self-pay

## 2019-03-28 ENCOUNTER — Ambulatory Visit (HOSPITAL_COMMUNITY): Payer: Medicare Other | Attending: Internal Medicine

## 2019-03-28 DIAGNOSIS — I2510 Atherosclerotic heart disease of native coronary artery without angina pectoris: Secondary | ICD-10-CM | POA: Insufficient documentation

## 2019-03-28 DIAGNOSIS — M7989 Other specified soft tissue disorders: Secondary | ICD-10-CM

## 2019-03-31 ENCOUNTER — Telehealth: Payer: Self-pay | Admitting: *Deleted

## 2019-03-31 DIAGNOSIS — K769 Liver disease, unspecified: Secondary | ICD-10-CM

## 2019-03-31 NOTE — Telephone Encounter (Signed)
Results/review by Dr. Harrington Challenger reported to patient.  Pt agreeable to ct w/ contrast to further eval cystic areas in liver.  Order placed.  Aware she will receive a call to schedule. Creatine normal on 03/24/19.   Adv to try wearing compression stockings for leg swelling, elevate them when sitting and increasing activity - taking walks- to help improve/decrease leg swelling.

## 2019-03-31 NOTE — Telephone Encounter (Signed)
-----   Message from Fay Records, MD sent at 03/29/2019 10:46 PM EDT ----- No pericardial effusion seen Pumping function of heart is normal MIld diastolic dysfunction DOes not explain swelling in legs There are cystic areas in liver   WOld recomm  CT of abd/pelvis (with contrast)

## 2019-04-28 ENCOUNTER — Ambulatory Visit (INDEPENDENT_AMBULATORY_CARE_PROVIDER_SITE_OTHER)
Admission: RE | Admit: 2019-04-28 | Discharge: 2019-04-28 | Disposition: A | Discharge status: Home / Self Care | Payer: Medicare Other | Source: Ambulatory Visit | Attending: Internal Medicine | Admitting: Internal Medicine

## 2019-04-28 ENCOUNTER — Other Ambulatory Visit: Payer: Self-pay

## 2019-04-28 DIAGNOSIS — K769 Liver disease, unspecified: Secondary | ICD-10-CM | POA: Diagnosis not present

## 2019-04-28 MED ORDER — IOHEXOL 300 MG/ML  SOLN
100.0000 mL | Freq: Once | INTRAMUSCULAR | Status: AC | PRN
  Administered 2019-04-28: 100 mL via INTRAVENOUS

## 2019-05-13 ENCOUNTER — Ambulatory Visit: Payer: Medicare Other | Admitting: Sports Medicine

## 2019-05-13 ENCOUNTER — Other Ambulatory Visit: Payer: Self-pay

## 2019-05-13 ENCOUNTER — Encounter: Payer: Self-pay | Admitting: Sports Medicine

## 2019-05-13 DIAGNOSIS — B351 Tinea unguium: Secondary | ICD-10-CM | POA: Diagnosis not present

## 2019-05-13 DIAGNOSIS — L909 Atrophic disorder of skin, unspecified: Secondary | ICD-10-CM

## 2019-05-13 DIAGNOSIS — I739 Peripheral vascular disease, unspecified: Secondary | ICD-10-CM

## 2019-05-13 DIAGNOSIS — M79672 Pain in left foot: Secondary | ICD-10-CM

## 2019-05-13 DIAGNOSIS — M79675 Pain in left toe(s): Secondary | ICD-10-CM

## 2019-05-13 DIAGNOSIS — M79674 Pain in right toe(s): Secondary | ICD-10-CM

## 2019-05-13 DIAGNOSIS — L84 Corns and callosities: Secondary | ICD-10-CM

## 2019-05-13 DIAGNOSIS — M216X9 Other acquired deformities of unspecified foot: Secondary | ICD-10-CM

## 2019-05-13 DIAGNOSIS — M79671 Pain in right foot: Secondary | ICD-10-CM

## 2019-05-13 NOTE — Progress Notes (Signed)
Subjective: AZRAH CANDELL is a 78 y.o. female patient seen today in office with complaint of long toenails and pain to the balls reports that she got a new over-the-counter U-shaped gel pad which seems to be very helpful and is wondering if it is okay for her to continue with wearing this.  Patient denies any other new health problems or pedal complaints at this time.   Patient Active Problem List   Diagnosis Date Noted  . Bilateral impacted cerumen 11/29/2017  . Presbycusis of both ears 11/29/2017  . Pleural effusion 06/20/2017  . Microscopic colitis 06/01/2017  . Acute respiratory failure ([place]) 05/31/2017  . Essential hypertension 05/31/2017  . Hypothyroidism 05/31/2017  . Varicose veins of lower extremities with complications [id number]  . Edema 06/03/2014  . Restless legs syndrome (RLS) 02/28/2013    Current Outpatient Medications on File Prior to Visit  Medication Sig Dispense Refill  . alendronate (FOSAMAX) 70 MG tablet     . aspirin 81 MG tablet Take 81 mg by mouth at bedtime.     . bisoprolol (ZEBETA) 5 MG tablet Take 1 tablet (5 mg total) by mouth daily. 30 tablet 6  . calcium gluconate 500 MG tablet Take 500 mg by mouth 2 (two) times daily.     . Cholecalciferol (VITAMIN D) 2000 units CAPS Take 2,000 Units by mouth daily.     . folic acid (FOLVITE) [id number] MCG tablet Take 800 mcg by mouth at bedtime.     . furosemide (LASIX) 20 MG tablet Take 60 mg by mouth daily.    Marland Kitchen levothyroxine (SYNTHROID, LEVOTHROID) 125 MCG tablet Take 125 mcg by mouth daily before breakfast.    . losartan (COZAAR) 100 MG tablet     . losartan (COZAAR) 50 MG tablet Take 50 mg by mouth daily.    . Multiple Vitamin (MULTIVITAMIN) tablet Take 1 tablet by mouth daily.    . naproxen sodium (ALEVE) 220 MG tablet Take 220 mg by mouth as needed.     . Red Yeast Rice 600 MG CAPS Take 600 mg by mouth 2 (two) times daily.    Marland Kitchen rOPINIRole (REQUIP) 1 MG tablet Take 2 mg by mouth. 2 mg  tablets in afternoon and 3 mg  tablets at bedtime    . sulfaSALAzine (AZULFIDINE) 500 MG EC tablet Take 1,500 mg by mouth 2 (two) times daily.     No current facility-administered medications on file prior to visit.     Allergies  Allergen Reactions  . Adhesive [Tape]   . Codeine   . Neosporin [Neomycin-Bacitracin Zn-Polymyx]   . Penicillins Rash    Has patient had a PCN reaction causing immediate rash, facial/tongue/throat swelling, SOB or lightheadedness with hypotension: No Has patient had a PCN reaction causing severe rash involving mucus membranes or skin necrosis: No Has patient had a PCN reaction that required hospitalization: No Has patient had a PCN reaction occurring within the last 10 years: No If all of the above answers are "NO", then may proceed with Cephalosporin use.    Objective: Physical Exam  General: Well developed, nourished, no acute distress, awake, alert and oriented x 3  Vascular: Dorsalis pedis artery 1/4 bilateral, Posterior tibial artery 1/4 bilateral, skin temperature warm to warm proximal to distal bilateral lower extremities, no varicosities, pedal hair present bilateral.  Neurological: Gross sensation present via light touch bilateral.   Dermatological: Skin is warm, dry, and supple bilateral, Nails 1-10 are mildly elongated thick, and discolored with mild subungal debris,  no webspace macerations present bilateral, no open lesions present bilateral, + hyperkeratotic tissue present plantar forefoot on left and dorsal 5th toe on right. No signs of infection bilateral.  Musculoskeletal: Asymptomatic fat pad atrophy and hammertoe boney deformities noted bilateral. Muscular strength within normal limits without pain on range of motion. No pain with calf compression bilateral.  Assessment and Plan:  Problem List Items Addressed This Visit    None    Visit Diagnoses    Pain due to onychomycosis of toenails of both feet    -  Primary   Callus       Atrophic plantar fat pad        Prominent metatarsal head, unspecified laterality       PVD (peripheral vascular disease) (HCC)       Relevant Medications   losartan (COZAAR) 100 MG tablet   Bilateral foot pain          -Examined patient.  -Discussed treatment options for painful callus and long nails bilateral -Mechanically debrided and reduced mycotic nails with sterile nail nipper -Mechanically parred callus skin x2 bilateral using chisel blade without incident at no charge since very minimal today -Patient may continue with using gel U-shaped padding -Advised good supportive shoes daily -Patient to return in 3 months for nail trim or PRN or sooner if symptoms worsen.  Landis Martins, DPM

## 2019-07-31 IMAGING — CR DG CHEST 2V
2 series · 2 of 2 positions shown · non-contrast
Comparison: CT 05/31/2017 and chest radiograph 05/31/2017

CLINICAL DATA: Right-sided chest pain.

EXAM:
CHEST  2 VIEW

[chest pa]
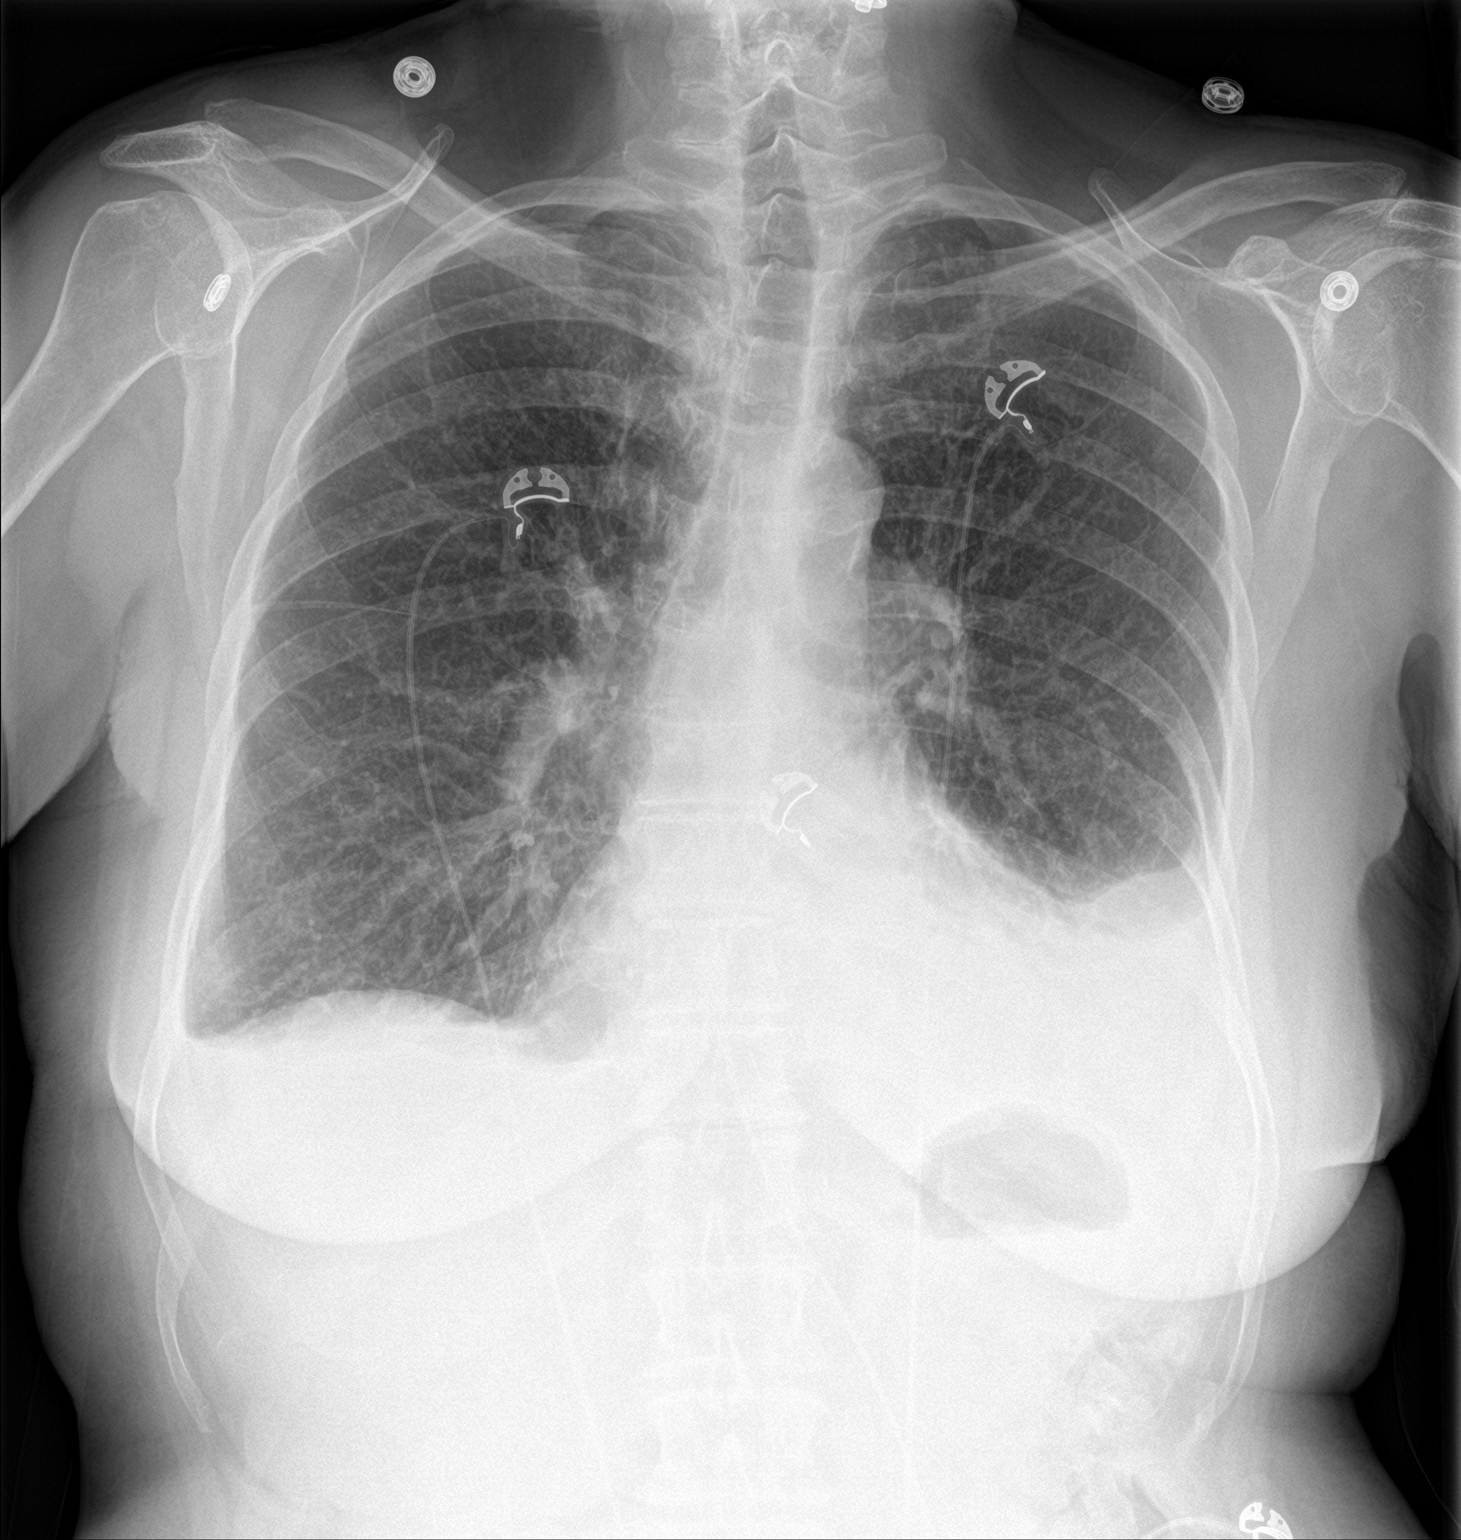

[chest lat]
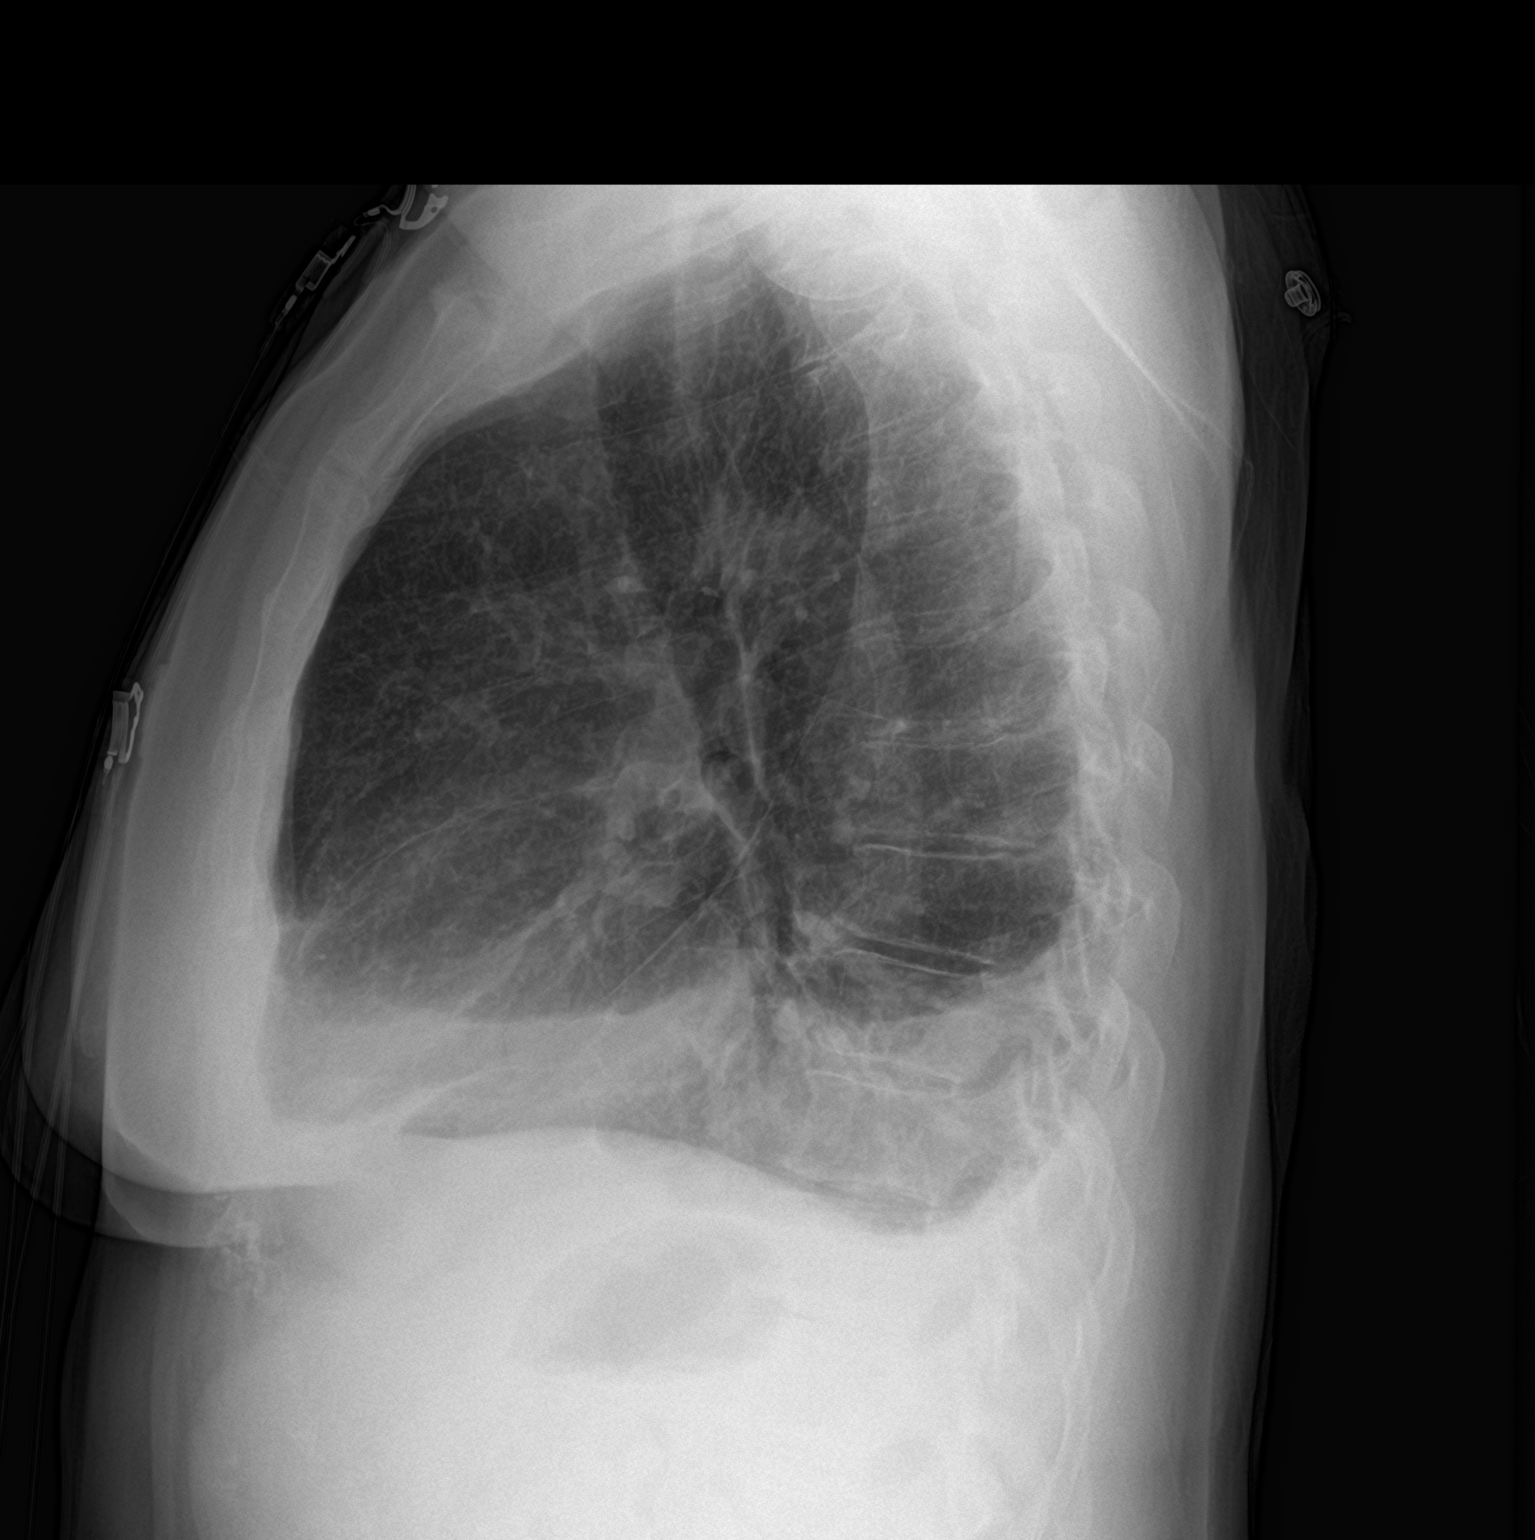

[2 of 2 positions shown; findings below may reference images not displayed]

FINDINGS: Persistent densities at the left chest base compatible with pleural
fluid and consolidation. New blunting at the right costophrenic
angle suggestive for small right pleural effusion. Upper lungs are
clear without pulmonary edema. Heart size is within normal limits.
Bony thorax is intact. Negative for a pneumothorax.
IMPRESSION: Bilateral pleural effusions, left side greater than right. Volume
loss/consolidation at the left lung base.

## 2019-08-01 IMAGING — DX DG CHEST 1V
1 series · 1 of 1 positions shown · non-contrast
Comparison: 06/02/2017

CLINICAL DATA: Pleural effusion status post left thoracentesis.

EXAM:
CHEST 1 VIEW

[x chest ap]
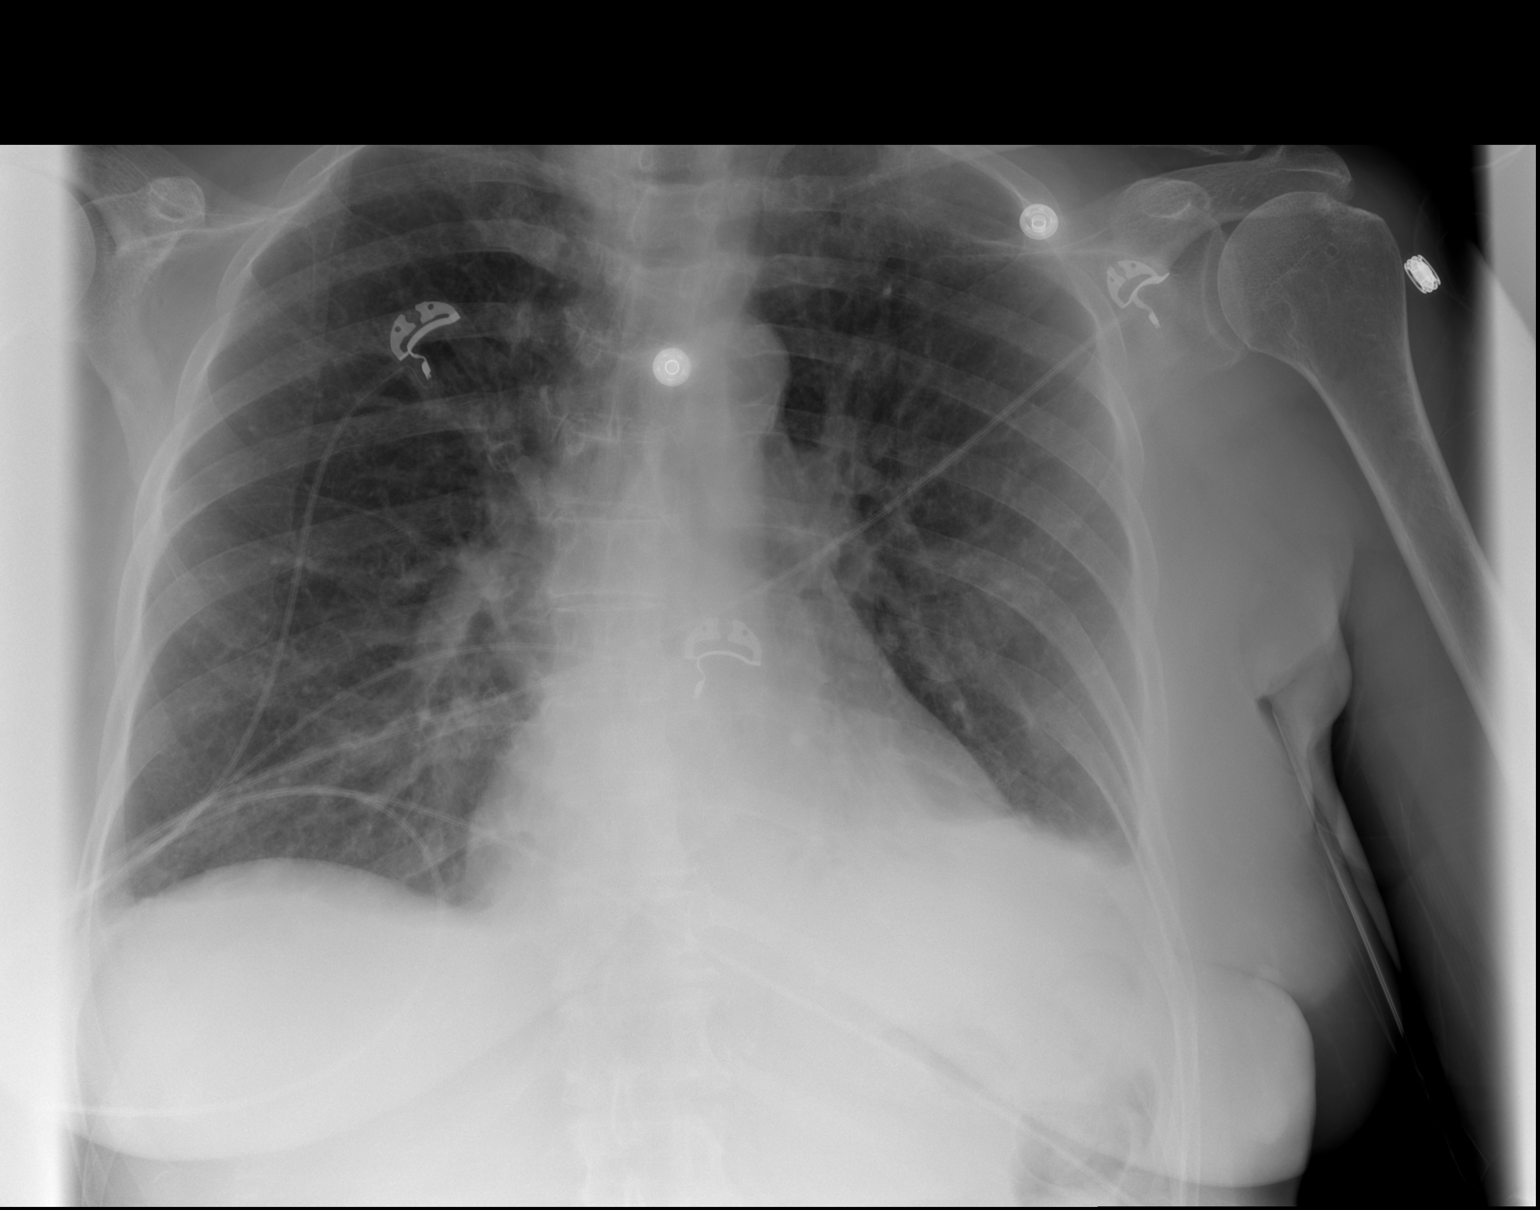

[1 of 1 positions shown; findings below may reference images not displayed]

FINDINGS: The cardiomediastinal silhouette is unchanged. There is a small left
pleural effusion, decreased in size following thoracentesis.
Persistent left basilar airspace opacity remains. Slight blunting of
the right costophrenic angle is unchanged and suggests a small
pleural effusion. No pneumothorax is identified.
IMPRESSION: 1. Small pleural effusions, decreased on the left following
thoracentesis. No pneumothorax.
2. Persistent left basilar atelectasis or consolidation.

## 2019-09-16 ENCOUNTER — Ambulatory Visit: Payer: Medicare Other | Admitting: Sports Medicine

## 2019-09-16 ENCOUNTER — Encounter: Payer: Self-pay | Admitting: Sports Medicine

## 2019-09-16 ENCOUNTER — Other Ambulatory Visit: Payer: Self-pay

## 2019-09-16 DIAGNOSIS — M216X9 Other acquired deformities of unspecified foot: Secondary | ICD-10-CM

## 2019-09-16 DIAGNOSIS — B351 Tinea unguium: Secondary | ICD-10-CM

## 2019-09-16 DIAGNOSIS — M79674 Pain in right toe(s): Secondary | ICD-10-CM

## 2019-09-16 DIAGNOSIS — L84 Corns and callosities: Secondary | ICD-10-CM

## 2019-09-16 DIAGNOSIS — I739 Peripheral vascular disease, unspecified: Secondary | ICD-10-CM

## 2019-09-16 DIAGNOSIS — M79675 Pain in left toe(s): Secondary | ICD-10-CM

## 2019-09-16 DIAGNOSIS — M79672 Pain in left foot: Secondary | ICD-10-CM

## 2019-09-16 DIAGNOSIS — M79671 Pain in right foot: Secondary | ICD-10-CM

## 2019-09-16 DIAGNOSIS — L909 Atrophic disorder of skin, unspecified: Secondary | ICD-10-CM

## 2019-09-16 NOTE — Progress Notes (Signed)
Subjective: Chelsea Taylor is a 79 y.o. female patient seen today in office with complaint of mildly painful thickened and elongated toenails and callus; unable to trim. Patient reports that callus on right foot is gone and there is not any pain to the area. No other pedal complaints. No other issues noted.   Patient Active Problem List   Diagnosis Date Noted  . Bilateral impacted cerumen 11/29/2017  . Presbycusis of both ears 11/29/2017  . Pleural effusion 06/20/2017  . Microscopic colitis 06/01/2017  . Acute respiratory failure ([place]) 05/31/2017  . Essential hypertension 05/31/2017  . Hypothyroidism 05/31/2017  . Varicose veins of lower extremities with complications 58/59/2924  . Edema 06/03/2014  . Restless legs syndrome (RLS) 02/28/2013    Current Outpatient Medications on File Prior to Visit  Medication Sig Dispense Refill  . alendronate (FOSAMAX) 70 MG tablet     . aspirin 81 MG tablet Take 81 mg by mouth at bedtime.     . bisoprolol (ZEBETA) 5 MG tablet Take 1 tablet (5 mg total) by mouth daily. 30 tablet 6  . calcium gluconate 500 MG tablet Take 500 mg by mouth 2 (two) times daily.     . Cholecalciferol (VITAMIN D) 2000 units CAPS Take 2,000 Units by mouth daily.     . folic acid (FOLVITE) 462 MCG tablet Take 800 mcg by mouth at bedtime.     . furosemide (LASIX) 20 MG tablet Take 60 mg by mouth daily.    Marland Kitchen levothyroxine (SYNTHROID, LEVOTHROID) 125 MCG tablet Take 125 mcg by mouth daily before breakfast.    . losartan (COZAAR) 100 MG tablet     . losartan (COZAAR) 50 MG tablet Take 50 mg by mouth daily.    . Multiple Vitamin (MULTIVITAMIN) tablet Take 1 tablet by mouth daily.    . naproxen sodium (ALEVE) 220 MG tablet Take 220 mg by mouth as needed.     . Red Yeast Rice 600 MG CAPS Take 600 mg by mouth 2 (two) times daily.    Marland Kitchen rOPINIRole (REQUIP) 1 MG tablet Take 2 mg by mouth. 2 mg  tablets in afternoon and 3 mg tablets at bedtime    . sulfaSALAzine (AZULFIDINE) 500 MG EC  tablet Take 1,500 mg by mouth 2 (two) times daily.     No current facility-administered medications on file prior to visit.    Allergies  Allergen Reactions  . Adhesive [Tape]   . Codeine   . Neosporin [Neomycin-Bacitracin Zn-Polymyx]   . Penicillins Rash    Has patient had a PCN reaction causing immediate rash, facial/tongue/throat swelling, SOB or lightheadedness with hypotension: No Has patient had a PCN reaction causing severe rash involving mucus membranes or skin necrosis: No Has patient had a PCN reaction that required hospitalization: No Has patient had a PCN reaction occurring within the last 10 years: No If all of the above answers are "NO", then may proceed with Cephalosporin use.    Objective: Physical Exam  General: Well developed, nourished, no acute distress, awake, alert and oriented x 3  Vascular: Dorsalis pedis artery 1/4 bilateral, Posterior tibial artery 1/4 bilateral, skin temperature warm to warm proximal to distal bilateral lower extremities, no varicosities, pedal hair present bilateral.  Neurological: Gross sensation present via light touch bilateral.   Dermatological: Skin is warm, dry, and supple bilateral, Nails 1-10 are tender, long, thick, and discolored with mild subungal debris, no webspace macerations present bilateral, no open lesions present bilateral, + minimal hyperkeratotic tissue present  bilateral plantar forefoot. No signs of infection bilateral.  Musculoskeletal: Asymptomatic fat pad atrophy and hammertoe boney deformities noted bilateral. Muscular strength within normal limits without painon range of motion. No pain with calf compression bilateral.  Assessment and Plan:  Problem List Items Addressed This Visit    None    Visit Diagnoses    Pain due to onychomycosis of toenails of both feet    -  Primary   Callus       Atrophic plantar fat pad       Prominent metatarsal head, unspecified laterality       PVD (peripheral vascular  disease) (HCC)       Bilateral foot pain           -Examined patient.  -Re-Discussed treatment options for painful mycotic nails. -Mechanically debrided and reduced mycotic nails with sterile nail nipper and dremel without incident -Minimal keratosis bilateral forefoot -Continue with skin emollients and met pads as needed -Patient to return in 3 months for nail trim or sooner if symptoms worsen.  Landis Martins, DPM

## 2019-10-02 ENCOUNTER — Ambulatory Visit: Payer: Medicare Other | Attending: Internal Medicine

## 2019-10-02 DIAGNOSIS — Z23 Encounter for immunization: Secondary | ICD-10-CM | POA: Insufficient documentation

## 2019-10-02 NOTE — Progress Notes (Signed)
   Covid-19 Vaccination Clinic  Name:  Chelsea Taylor    MRN: [id number] DOB: 30-Sep-1940  10/02/2019  [name] was observed post Covid-19 immunization for 15 minutes without incidence. She was provided with Vaccine Information Sheet and instruction to access the V-Safe system.   [name] was instructed to call 911 with any severe reactions post vaccine: Marland Kitchen Difficulty breathing  . Swelling of your face and throat  . A fast heartbeat  . A bad rash all over your body  . Dizziness and weakness    Immunizations Administered    Name Date Dose VIS Date Route   Pfizer COVID-19 Vaccine 10/02/2019  2:56 PM 0.3 mL 07/18/2019 Intramuscular   Manufacturer: [place]   Lot: [id number]   [place]: [id number]

## 2019-10-28 ENCOUNTER — Ambulatory Visit: Payer: Medicare Other | Attending: Internal Medicine

## 2019-10-28 DIAGNOSIS — Z23 Encounter for immunization: Secondary | ICD-10-CM

## 2019-10-28 NOTE — Progress Notes (Signed)
   Covid-19 Vaccination Clinic  Name:  Chelsea Taylor    MRN: [id number] DOB: 11-29-1940  10/28/2019  [name] was observed post Covid-19 immunization for 15 minutes without incident. She was provided with Vaccine Information Sheet and instruction to access the V-Safe system.   [name] was instructed to call 911 with any severe reactions post vaccine: Marland Kitchen Difficulty breathing  . Swelling of face and throat  . A fast heartbeat  . A bad rash all over body  . Dizziness and weakness   Immunizations Administered    Name Date Dose VIS Date Route   Pfizer COVID-19 Vaccine 10/28/2019  2:25 PM 0.3 mL 07/18/2019 Intramuscular   Manufacturer: [place]   Lot: [id number]   [place]: [id number]

## 2019-12-16 ENCOUNTER — Encounter: Payer: Self-pay | Admitting: Sports Medicine

## 2019-12-16 ENCOUNTER — Other Ambulatory Visit: Payer: Self-pay

## 2019-12-16 ENCOUNTER — Ambulatory Visit: Payer: Medicare Other | Admitting: Sports Medicine

## 2019-12-16 VITALS — Temp 97.4°F

## 2019-12-16 DIAGNOSIS — I739 Peripheral vascular disease, unspecified: Secondary | ICD-10-CM

## 2019-12-16 DIAGNOSIS — M79675 Pain in left toe(s): Secondary | ICD-10-CM | POA: Diagnosis not present

## 2019-12-16 DIAGNOSIS — M79674 Pain in right toe(s): Secondary | ICD-10-CM | POA: Diagnosis not present

## 2019-12-16 DIAGNOSIS — M79671 Pain in right foot: Secondary | ICD-10-CM

## 2019-12-16 DIAGNOSIS — B351 Tinea unguium: Secondary | ICD-10-CM | POA: Diagnosis not present

## 2019-12-16 DIAGNOSIS — M79672 Pain in left foot: Secondary | ICD-10-CM

## 2019-12-16 NOTE — Progress Notes (Signed)
Subjective: RHYLIE STEHR is a 79 y.o. female patient seen today in office with complaint of mildly painful thickened and elongated toenails; unable to trim. Patient reports that she is having problems with restless legs today. No other pedal complaints. No other issues noted.   Patient Active Problem List   Diagnosis Date Noted  . Bilateral impacted cerumen 11/29/2017  . Presbycusis of both ears 11/29/2017  . Pleural effusion 06/20/2017  . Microscopic colitis 06/01/2017  . Acute respiratory failure ([place]) 05/31/2017  . Essential hypertension 05/31/2017  . Hypothyroidism 05/31/2017  . Varicose veins of lower extremities with complications 41/96/2229  . Edema 06/03/2014  . Restless legs syndrome (RLS) 02/28/2013    Current Outpatient Medications on File Prior to Visit  Medication Sig Dispense Refill  . alendronate (FOSAMAX) 70 MG tablet     . aspirin 81 MG tablet Take 81 mg by mouth at bedtime.     . bisoprolol (ZEBETA) 5 MG tablet Take 1 tablet (5 mg total) by mouth daily. 30 tablet 6  . calcium gluconate 500 MG tablet Take 500 mg by mouth 2 (two) times daily.     . Cholecalciferol (VITAMIN D) 2000 units CAPS Take 2,000 Units by mouth daily.     . folic acid (FOLVITE) 798 MCG tablet Take 800 mcg by mouth at bedtime.     . furosemide (LASIX) 20 MG tablet Take 60 mg by mouth daily.    Marland Kitchen levothyroxine (SYNTHROID, LEVOTHROID) 125 MCG tablet Take 125 mcg by mouth daily before breakfast.    . losartan (COZAAR) 100 MG tablet     . losartan (COZAAR) 50 MG tablet Take 50 mg by mouth daily.    . Multiple Vitamin (MULTIVITAMIN) tablet Take 1 tablet by mouth daily.    . naproxen sodium (ALEVE) 220 MG tablet Take 220 mg by mouth as needed.     . Red Yeast Rice 600 MG CAPS Take 600 mg by mouth 2 (two) times daily.    Marland Kitchen rOPINIRole (REQUIP) 1 MG tablet Take 2 mg by mouth. 2 mg  tablets in afternoon and 3 mg tablets at bedtime    . sulfaSALAzine (AZULFIDINE) 500 MG EC tablet Take 1,500 mg by mouth 2  (two) times daily.     No current facility-administered medications on file prior to visit.    Allergies  Allergen Reactions  . Adhesive [Tape]   . Codeine   . Neosporin [Neomycin-Bacitracin Zn-Polymyx]   . Penicillins Rash    Has patient had a PCN reaction causing immediate rash, facial/tongue/throat swelling, SOB or lightheadedness with hypotension: No Has patient had a PCN reaction causing severe rash involving mucus membranes or skin necrosis: No Has patient had a PCN reaction that required hospitalization: No Has patient had a PCN reaction occurring within the last 10 years: No If all of the above answers are "NO", then may proceed with Cephalosporin use.    Objective: Physical Exam  General: Well developed, nourished, no acute distress, awake, alert and oriented x 3  Vascular: Dorsalis pedis artery 1/4 bilateral, Posterior tibial artery 1/4 bilateral, skin temperature warm to warm proximal to distal bilateral lower extremities, no varicosities, pedal hair present bilateral.  Neurological: Gross sensation present via light touch bilateral.   Dermatological: Skin is warm, dry, and supple bilateral, Nails 1-10 are tender, long, thick, and discolored with mild subungal debris, no webspace macerations present bilateral, no open lesions present bilateral, No hyperkeratotic tissue present bilateral plantar forefoot. No signs of infection bilateral.  Musculoskeletal:  Asymptomatic fat pad atrophy and hammertoe boney deformities noted bilateral. Muscular strength within normal limits without painon range of motion. No pain with calf compression bilateral.  Assessment and Plan:  Problem List Items Addressed This Visit    None    Visit Diagnoses    Pain due to onychomycosis of toenails of both feet    -  Primary   PVD (peripheral vascular disease) ([place])       Bilateral foot pain         -Examined patient.  -Discussed treatment options for painful mycotic nails. -Mechanically  debrided and reduced mycotic nails with sterile nail nipper and dremel without incident -Continue with skin emollients and met pads as needed to reduced callus build up to balls of both feet; today there was no build up present  -Patient to return in 3 months for nail trim or sooner if symptoms worsen.  Landis Martins, DPM

## 2020-03-16 ENCOUNTER — Encounter: Payer: Self-pay | Admitting: Neurology

## 2020-03-18 ENCOUNTER — Encounter: Payer: Self-pay | Admitting: Sports Medicine

## 2020-03-18 ENCOUNTER — Other Ambulatory Visit: Payer: Self-pay

## 2020-03-18 ENCOUNTER — Ambulatory Visit: Payer: Medicare Other | Admitting: Sports Medicine

## 2020-03-18 DIAGNOSIS — M79674 Pain in right toe(s): Secondary | ICD-10-CM

## 2020-03-18 DIAGNOSIS — M2041 Other hammer toe(s) (acquired), right foot: Secondary | ICD-10-CM

## 2020-03-18 DIAGNOSIS — I739 Peripheral vascular disease, unspecified: Secondary | ICD-10-CM | POA: Diagnosis not present

## 2020-03-18 DIAGNOSIS — M79671 Pain in right foot: Secondary | ICD-10-CM

## 2020-03-18 DIAGNOSIS — M216X9 Other acquired deformities of unspecified foot: Secondary | ICD-10-CM

## 2020-03-18 DIAGNOSIS — L909 Atrophic disorder of skin, unspecified: Secondary | ICD-10-CM

## 2020-03-18 DIAGNOSIS — M79672 Pain in left foot: Secondary | ICD-10-CM

## 2020-03-18 DIAGNOSIS — M2042 Other hammer toe(s) (acquired), left foot: Secondary | ICD-10-CM

## 2020-03-18 DIAGNOSIS — M79675 Pain in left toe(s): Secondary | ICD-10-CM

## 2020-03-18 DIAGNOSIS — L84 Corns and callosities: Secondary | ICD-10-CM

## 2020-03-18 DIAGNOSIS — B351 Tinea unguium: Secondary | ICD-10-CM | POA: Diagnosis not present

## 2020-03-18 NOTE — Progress Notes (Signed)
Subjective: Chelsea Taylor is a 79 y.o. female patient seen today in office with complaint of mildly painful thickened and elongated toenails; unable to trim. Reports still no callus. Has been doing good and will see ortho hand. No other pedal complaints.  Patient Active Problem List   Diagnosis Date Noted  . Bilateral impacted cerumen 11/29/2017  . Presbycusis of both ears 11/29/2017  . Pleural effusion 06/20/2017  . Microscopic colitis 06/01/2017  . Acute respiratory failure ([place]) 05/31/2017  . Essential hypertension 05/31/2017  . Hypothyroidism 05/31/2017  . Varicose veins of lower extremities with complications 40/98/1191  . Edema 06/03/2014  . Restless legs syndrome (RLS) 02/28/2013    Current Outpatient Medications on File Prior to Visit  Medication Sig Dispense Refill  . alendronate (FOSAMAX) 70 MG tablet     . aspirin 81 MG tablet Take 81 mg by mouth at bedtime.     . bisoprolol (ZEBETA) 5 MG tablet Take 1 tablet (5 mg total) by mouth daily. 30 tablet 6  . calcium gluconate 500 MG tablet Take 500 mg by mouth 2 (two) times daily.     . Cholecalciferol (VITAMIN D) 2000 units CAPS Take 2,000 Units by mouth daily.     . folic acid (FOLVITE) 478 MCG tablet Take 800 mcg by mouth at bedtime.     . furosemide (LASIX) 20 MG tablet Take 60 mg by mouth daily.    Marland Kitchen levothyroxine (SYNTHROID, LEVOTHROID) 125 MCG tablet Take 125 mcg by mouth daily before breakfast.    . losartan (COZAAR) 100 MG tablet     . losartan (COZAAR) 50 MG tablet Take 50 mg by mouth daily.    . Multiple Vitamin (MULTIVITAMIN) tablet Take 1 tablet by mouth daily.    . naproxen sodium (ALEVE) 220 MG tablet Take 220 mg by mouth as needed.     . Red Yeast Rice 600 MG CAPS Take 600 mg by mouth 2 (two) times daily.    Marland Kitchen rOPINIRole (REQUIP) 1 MG tablet Take 2 mg by mouth. 2 mg  tablets in afternoon and 3 mg tablets at bedtime    . sulfaSALAzine (AZULFIDINE) 500 MG EC tablet Take 1,500 mg by mouth 2 (two) times daily.      No current facility-administered medications on file prior to visit.    Allergies  Allergen Reactions  . Adhesive [Tape]   . Codeine   . Neosporin [Neomycin-Bacitracin Zn-Polymyx]   . Penicillins Rash    Has patient had a PCN reaction causing immediate rash, facial/tongue/throat swelling, SOB or lightheadedness with hypotension: No Has patient had a PCN reaction causing severe rash involving mucus membranes or skin necrosis: No Has patient had a PCN reaction that required hospitalization: No Has patient had a PCN reaction occurring within the last 10 years: No If all of the above answers are "NO", then may proceed with Cephalosporin use.    Objective: Physical Exam  General: Well developed, nourished, no acute distress, awake, alert and oriented x 3  Vascular: Dorsalis pedis artery 1/4 bilateral, Posterior tibial artery 1/4 bilateral, skin temperature warm to warm proximal to distal bilateral lower extremities, no varicosities, pedal hair present bilateral.  Neurological: Gross sensation present via light touch bilateral.   Dermatological: Skin is warm, dry, and supple bilateral, Nails 1-10 are tender, long, thick, and discolored with mild subungal debris, no webspace macerations present bilateral, no open lesions present bilateral, No hyperkeratotic tissue present bilateral plantar forefoot. No signs of infection bilateral.  Musculoskeletal: Asymptomatic fat pad  atrophy and hammertoe boney deformities noted bilateral. Muscular strength within normal limits without painon range of motion. No pain with calf compression bilateral.  Assessment and Plan:  Problem List Items Addressed This Visit    None    Visit Diagnoses    Pain due to onychomycosis of toenails of both feet    -  Primary   PVD (peripheral vascular disease) (HCC)       Bilateral foot pain       Callus       Atrophic plantar fat pad       Prominent metatarsal head, unspecified laterality       Hammer toes of  both feet         -Examined patient.  -Discussed treatment options for painful mycotic nails. -Mechanically debrided and reduced mycotic nails with sterile nail nipper and dremel without incident -Continue with skin emollients and good supportive shoes and padding to prevent callus recurrence  -Patient to return in 3 months for nail trim or sooner if symptoms worsen.  Landis Martins, DPM

## 2020-05-14 ENCOUNTER — Other Ambulatory Visit: Payer: Self-pay

## 2020-05-14 ENCOUNTER — Ambulatory Visit: Payer: Medicare Other | Attending: Internal Medicine

## 2020-05-14 DIAGNOSIS — Z23 Encounter for immunization: Secondary | ICD-10-CM

## 2020-05-14 NOTE — Progress Notes (Signed)
   Covid-19 Vaccination Clinic  Name:  MILAYAH KRELL    MRN: [id number] DOB: 1941-08-01  05/14/2020  Ms. Brewton was observed post Covid-19 immunization for 15 minutes without incident. She was provided with Vaccine Information Sheet and instruction to access the V-Safe system.   Ms. Betters was instructed to call 911 with any severe reactions post vaccine: Marland Kitchen Difficulty breathing  . Swelling of face and throat  . A fast heartbeat  . A bad rash all over body  . Dizziness and weakness

## 2020-06-07 ENCOUNTER — Other Ambulatory Visit: Payer: Self-pay

## 2020-06-07 ENCOUNTER — Ambulatory Visit: Payer: Medicare Other | Admitting: Neurology

## 2020-06-07 ENCOUNTER — Other Ambulatory Visit (INDEPENDENT_AMBULATORY_CARE_PROVIDER_SITE_OTHER): Payer: Medicare Other

## 2020-06-07 ENCOUNTER — Encounter: Payer: Self-pay | Admitting: Neurology

## 2020-06-07 VITALS — BP 158/76 | HR 79 | Ht 66.0 in | Wt 191.2 lb

## 2020-06-07 DIAGNOSIS — G3184 Mild cognitive impairment, so stated: Secondary | ICD-10-CM

## 2020-06-07 LAB — VITAMIN B12: Vitamin B-12: 368 pg/mL (ref 211–911)

## 2020-06-07 NOTE — Patient Instructions (Addendum)
1. Bloodwork for B12  2. Schedule MRI brain without contrast  3. Schedule Neurocognitive testing  4. Follow-up after tests, call for any changes.   You have been referred for a neurocognitive evaluation in our office.   The evaluation has two parts.   . The first part of the evaluation is a clinical interview with the neuropsychologist (Dr. Melvyn Novas or Dr. Nicole Kindred). Please bring someone with you to this appointment if possible, as it is helpful for the doctor to hear from both you and another adult who knows you well.   . The second part of the evaluation is testing with the doctor's technician Hinton Dyer or Maudie Mercury). The testing includes a variety of tasks- mostly question-and-answer, some paper-and-pencil. There is nothing you need to do to prepare for this appointment, but having a good night's sleep prior to the testing, taking medications as you normally would, and bringing eyeglasses and hearing aids (if you wear them), is advised. Please make sure that you wear a mask to the appointment.  Please note: We have to reserve several hours of the neuropsychologist's time and the psychometrician's time for your evaluation appointment. As such, please note that there is a No-Show fee of $100. If you are unable to attend any of your appointments, please contact our office as soon as possible to reschedule.

## 2020-06-07 NOTE — Progress Notes (Signed)
NEUROLOGY CONSULTATION NOTE  Chelsea Taylor MRN: [id number] DOB: May 03, 1941  Referring provider: Dr. Leighton Ruff Primary care provider: Dr. Leighton Ruff  Reason for consult:  Memory changes  Dear Dr Drema Dallas:  Thank you for your kind referral of Chelsea Taylor for consultation of the above symptoms. Although her history is well known to you, please allow me to reiterate it for the purpose of our medical record. She is alone in the office today. Records and images were personally reviewed where available.   HISTORY OF PRESENT ILLNESS: This is an 79 year old right-handed woman with a history of hypertension, hypothyroidism, RLS, colitis, presenting for evaluation of memory changes. Her daughter expressed concern last August 2021 that she was not remembering her phone number, address, or where her daughter lived. MMSE 28/30 in 03/2020. She is alone in the office today. She reports her husband passed away last 2022-11-26, her daughter lives in [place] and went home yesterday. She reports her memory is okay at times, other times she cannot remember things. She notes word-finding difficulties, then the word comes back. She started noticing changes a year ago. She denies missing medications, however then reports that her daughter fixed her pills in a pillbox before she left because when her daughter asked her if she took it, she would not know. She will have a nurse coming this week to set up her pillbox. She denies getting lost driving. Her husband had been managing finances, she paid some bills since he passed away. She denies misplacing things, then reports irritation with herself when she is looking for things. She denies leaving the stove on, she does not like cooking. She feels her mood is okay overall. She denies any hallucinations.   She denies any headaches, dizziness, diplopia, dysarthria, dysphagia, neck/back pain, focal numbness/tingling/weakness, bowel/bladder dysfunction. No anosmia,  tremors, no falls. She has arthritis in her fingers. She does not sleep as much as she used to, feeling drowsy during the day but not taking naps. No family history of dementia, no history of significant head injuries or alcohol use.   Laboratory Data: TSH normal at PCP office  PAST MEDICAL HISTORY: Past Medical History:  Diagnosis Date  . Collagenous colitis   . Degenerative arthritis    Knees  . Dyslipidemia   . Fibromyalgia   . Hypertension   . Hypothyroid   . Restless legs syndrome (RLS) 02/28/2013  . Sleep apnea     PAST SURGICAL HISTORY: Past Surgical History:  Procedure Laterality Date  . APPENDECTOMY    . BACK SURGERY  Nov 26, 2010  . BREAST BIOPSY Left    Fibroadenoma  . CATARACT EXTRACTION    . DILATION AND CURETTAGE OF UTERUS    . FOOT SURGERY Bilateral    multiple  . [place]  11/25/2012  . [place]  11-25-2005  . HYSTEROSCOPY    . TONSILLECTOMY      MEDICATIONS: Current Outpatient Medications on File Prior to Visit  Medication Sig Dispense Refill  . alendronate (FOSAMAX) 70 MG tablet     . aspirin 81 MG tablet Take 81 mg by mouth at bedtime.     . bisoprolol (ZEBETA) 5 MG tablet Take 1 tablet (5 mg total) by mouth daily. 30 tablet 6  . calcium gluconate 500 MG tablet Take 500 mg by mouth 2 (two) times daily.     . Cholecalciferol (VITAMIN D) 2000 units CAPS Take 2,000 Units by mouth daily.     Marland Kitchen  folic acid (FOLVITE) 867 MCG tablet Take 800 mcg by mouth at bedtime.     . furosemide (LASIX) 20 MG tablet Take 60 mg by mouth daily.    Marland Kitchen levothyroxine (SYNTHROID, LEVOTHROID) 125 MCG tablet Take 125 mcg by mouth daily before breakfast.    . Multiple Vitamin (MULTIVITAMIN) tablet Take 1 tablet by mouth daily.    . naproxen sodium (ALEVE) 220 MG tablet Take 220 mg by mouth as needed.     . Red Yeast Rice 600 MG CAPS Take 600 mg by mouth 2 (two) times daily.    Marland Kitchen rOPINIRole (REQUIP) 1 MG tablet Take 2 mg by mouth. 2 mg  tablets in afternoon and 3 mg tablets at  bedtime    . sulfaSALAzine (AZULFIDINE) 500 MG EC tablet Take 1,500 mg by mouth 2 (two) times daily.    Marland Kitchen losartan (COZAAR) 100 MG tablet     . losartan (COZAAR) 50 MG tablet Take 50 mg by mouth daily.     No current facility-administered medications on file prior to visit.    ALLERGIES: Allergies  Allergen Reactions  . Adhesive [Tape]   . Codeine   . Neosporin [Neomycin-Bacitracin Zn-Polymyx]   . Penicillins Rash    Has patient had a PCN reaction causing immediate rash, facial/tongue/throat swelling, SOB or lightheadedness with hypotension: No Has patient had a PCN reaction causing severe rash involving mucus membranes or skin necrosis: No Has patient had a PCN reaction that required hospitalization: No Has patient had a PCN reaction occurring within the last 10 years: No If all of the above answers are "NO", then may proceed with Cephalosporin use.    FAMILY HISTORY: Family History  Problem Relation Age of Onset  . Heart failure Mother   . Heart disease Mother   . Hyperlipidemia Mother   . Heart attack Mother   . Colon cancer Father   . Cancer Father   . Hypertension Father   . Heart attack Brother   . Heart disease Brother   . Diabetes Daughter     SOCIAL HISTORY: Social History   Socioeconomic History  . Marital status: Widowed    Spouse name: Not on file  . Number of children: 1  . Years of education: 49  . Highest education level: Not on file  Occupational History  . Occupation: Retired  Tobacco Use  . Smoking status: Never Smoker  . Smokeless tobacco: Never Used  Vaping Use  . Vaping Use: Never used  Substance and Sexual Activity  . Alcohol use: Not Currently    Comment: Consumes alcohol twice per year  . Drug use: No  . Sexual activity: Not on file  Other Topics Concern  . Not on file  Social History Narrative   Right handed    Lives alone   Husband just passed away   Social Determinants of Health   Financial Resource Strain:   . Difficulty  of Paying Living Expenses: Not on file  Food Insecurity:   . Worried About Charity fundraiser in the Last Year: Not on file  . Ran Out of Food in the Last Year: Not on file  Transportation Needs:   . Lack of Transportation (Medical): Not on file  . Lack of Transportation (Non-Medical): Not on file  Physical Activity:   . Days of Exercise per Week: Not on file  . Minutes of Exercise per Session: Not on file  Stress:   . Feeling of Stress : Not on file  Social Connections:   . Frequency of Communication with Friends and Family: Not on file  . Frequency of Social Gatherings with Friends and Family: Not on file  . Attends Religious Services: Not on file  . Active Member of Clubs or Organizations: Not on file  . Attends Archivist Meetings: Not on file  . Marital Status: Not on file  Intimate Partner Violence:   . Fear of Current or Ex-Partner: Not on file  . Emotionally Abused: Not on file  . Physically Abused: Not on file  . Sexually Abused: Not on file     PHYSICAL EXAM: Vitals:   06/07/20 1029  BP: (!) 158/76  Pulse: 79  SpO2: 96%   General: No acute distress Head:  Normocephalic/atraumatic Skin/Extremities: No rash, no edema Neurological Exam: Mental status: alert and oriented to person, place, and time, no dysarthria or aphasia, Fund of knowledge is appropriate.  Recent and remote memory are impaired.  Attention and concentration are reduced.  [place] score 19/30 Montreal Cognitive Assessment  06/07/2020  Visuospatial/ Executive (0/5) 2  Naming (0/3) 3  Attention: Read list of digits (0/2) 2  Attention: Read list of letters (0/1) 0  Attention: Serial 7 subtraction starting at 100 (0/3) 3  Language: Repeat phrase (0/2) 0  Language : Fluency (0/1) 1  Abstraction (0/2) 2  Delayed Recall (0/5) 0  Orientation (0/6) 6  Total 19   Cranial nerves: CN I: not tested CN II: pupils equal, round and reactive to light, visual fields intact CN III, IV, VI:  full range  of motion, no nystagmus, no ptosis CN V: facial sensation intact CN VII: upper and lower face symmetric CN VIII: hearing intact to conversation CN IX, X: gag intact, uvula midline CN XI: sternocleidomastoid and trapezius muscles intact CN XII: tongue midline Bulk & Tone: normal, no fasciculations. Motor: 5/5 throughout with no pronator drift. Sensation: intact to light touch, cold, pin. Decreased vibration sense to knees bilaterally. Romberg test negative Deep Tendon Reflexes: +1 throughout Cerebellar: no incoordination on finger to nose testing Gait: narrow-based and steady, no ataxia Tremor: none   IMPRESSION: This is an 79 year old right-handed woman with a history of hypertension, hypothyroidism, RLS, colitis, presenting for evaluation of memory changes. Her neurological exam is non-focal, MOCA score 19/30, indicating Mild Cognitive Impairment. She denies any difficulties with complex tasks, however she is alone without family to corroborate history. She asked repeatedly about planned tests at the end of the visit. We discussed different causes of memory loss. Check B12. MRI brain without contrast will be ordered to assess for underlying structural abnormality and assess vascular load. Neuropsychological testing will be done to further evaluate cognitive complaints to help aid long-term planning as she now lives alone with daughter in [place]. Follow-up after tests, she knows to call for any changes.   Thank you for allowing me to participate in the care of this patient. Please do not hesitate to call for any questions or concerns.   Ellouise Newer, M.D.  CC: Dr. Drema Dallas

## 2020-06-08 ENCOUNTER — Telehealth: Payer: Self-pay

## 2020-06-08 NOTE — Telephone Encounter (Signed)
Pt called and informed that B12 level was fine

## 2020-06-08 NOTE — Telephone Encounter (Signed)
-----   Message from Cameron Sprang, MD sent at 06/08/2020  9:10 AM EDT ----- Pls let her know B12 level was fine, thanks

## 2020-06-24 ENCOUNTER — Other Ambulatory Visit: Payer: Self-pay

## 2020-06-24 ENCOUNTER — Encounter: Payer: Self-pay | Admitting: Sports Medicine

## 2020-06-24 ENCOUNTER — Ambulatory Visit: Payer: Medicare Other | Admitting: Sports Medicine

## 2020-06-24 DIAGNOSIS — I739 Peripheral vascular disease, unspecified: Secondary | ICD-10-CM

## 2020-06-24 DIAGNOSIS — B351 Tinea unguium: Secondary | ICD-10-CM

## 2020-06-24 DIAGNOSIS — M79672 Pain in left foot: Secondary | ICD-10-CM

## 2020-06-24 DIAGNOSIS — M79671 Pain in right foot: Secondary | ICD-10-CM

## 2020-06-24 DIAGNOSIS — M79674 Pain in right toe(s): Secondary | ICD-10-CM | POA: Diagnosis not present

## 2020-06-24 DIAGNOSIS — M79675 Pain in left toe(s): Secondary | ICD-10-CM

## 2020-06-24 NOTE — Progress Notes (Signed)
Subjective: Chelsea Taylor is a 79 y.o. female patient seen today in office with complaint of mildly painful thickened and elongated toenails; unable to trim. No other pedal complaints.  Patient Active Problem List   Diagnosis Date Noted  . Bilateral impacted cerumen 11/29/2017  . Presbycusis of both ears 11/29/2017  . Pleural effusion 06/20/2017  . Microscopic colitis 06/01/2017  . Acute respiratory failure ([place]) 05/31/2017  . Essential hypertension 05/31/2017  . Hypothyroidism 05/31/2017  . Varicose veins of lower extremities with complications 81/19/1478  . Edema 06/03/2014  . Restless legs syndrome (RLS) 02/28/2013    Current Outpatient Medications on File Prior to Visit  Medication Sig Dispense Refill  . alendronate (FOSAMAX) 70 MG tablet     . aspirin 81 MG tablet Take 81 mg by mouth at bedtime.     . bisoprolol (ZEBETA) 5 MG tablet Take 1 tablet (5 mg total) by mouth daily. 30 tablet 6  . calcium gluconate 500 MG tablet Take 500 mg by mouth 2 (two) times daily.     . celecoxib (CELEBREX) 200 MG capsule Take by mouth 2 (two) times daily.    . Cholecalciferol (VITAMIN D) 2000 units CAPS Take 2,000 Units by mouth daily.     . folic acid (FOLVITE) 295 MCG tablet Take 800 mcg by mouth at bedtime.     . furosemide (LASIX) 20 MG tablet Take 60 mg by mouth daily.    Marland Kitchen levothyroxine (SYNTHROID, LEVOTHROID) 125 MCG tablet Take 125 mcg by mouth daily before breakfast.    . losartan (COZAAR) 100 MG tablet     . losartan (COZAAR) 50 MG tablet Take 50 mg by mouth daily.    . Multiple Vitamin (MULTIVITAMIN) tablet Take 1 tablet by mouth daily.    . naproxen sodium (ALEVE) 220 MG tablet Take 220 mg by mouth as needed.     . Red Yeast Rice 600 MG CAPS Take 600 mg by mouth 2 (two) times daily.    Marland Kitchen rOPINIRole (REQUIP) 1 MG tablet Take 2 mg by mouth. 2 mg  tablets in afternoon and 3 mg tablets at bedtime    . sulfaSALAzine (AZULFIDINE) 500 MG EC tablet Take 1,500 mg by mouth 2 (two) times  daily.     No current facility-administered medications on file prior to visit.    Allergies  Allergen Reactions  . Adhesive [Tape]   . Codeine   . Neosporin [Neomycin-Bacitracin Zn-Polymyx]   . Penicillins Rash    Has patient had a PCN reaction causing immediate rash, facial/tongue/throat swelling, SOB or lightheadedness with hypotension: No Has patient had a PCN reaction causing severe rash involving mucus membranes or skin necrosis: No Has patient had a PCN reaction that required hospitalization: No Has patient had a PCN reaction occurring within the last 10 years: No If all of the above answers are "NO", then may proceed with Cephalosporin use.    Objective: Physical Exam  General: Well developed, nourished, no acute distress, awake, alert and oriented x 3  Vascular: Dorsalis pedis artery 1/4 bilateral, Posterior tibial artery 1/4 bilateral, skin temperature warm to warm proximal to distal bilateral lower extremities, no varicosities, pedal hair present bilateral.  Neurological: Gross sensation present via light touch bilateral.   Dermatological: Skin is warm, dry, and supple bilateral, Nails 1-10 are tender, long, thick, and discolored with mild subungal debris, no webspace macerations present bilateral, no open lesions present bilateral, No hyperkeratotic tissue present bilateral plantar forefoot. No signs of infection bilateral.  Musculoskeletal:  Asymptomatic fat pad atrophy and hammertoe boney deformities noted bilateral. Muscular strength within normal limits without painon range of motion. No pain with calf compression bilateral.  Assessment and Plan:  Problem List Items Addressed This Visit    None    Visit Diagnoses    Pain due to onychomycosis of toenails of both feet    -  Primary   PVD (peripheral vascular disease) ([place])       Bilateral foot pain         -Examined patient.  -Discussed treatment options for painful mycotic nails. -Mechanically debrided and  reduced mycotic nails with sterile nail nipper and dremel without incident -Recommend vicks to nails -Patient to return in 3 months for nail trim or sooner if symptoms worsen.  Landis Martins, DPM

## 2020-06-25 ENCOUNTER — Encounter: Payer: Self-pay | Admitting: Counselor

## 2020-06-25 ENCOUNTER — Ambulatory Visit: Payer: Medicare Other

## 2020-06-25 ENCOUNTER — Ambulatory Visit (INDEPENDENT_AMBULATORY_CARE_PROVIDER_SITE_OTHER): Payer: Medicare Other | Admitting: Counselor

## 2020-06-25 DIAGNOSIS — R413 Other amnesia: Secondary | ICD-10-CM

## 2020-06-25 DIAGNOSIS — F09 Unspecified mental disorder due to known physiological condition: Secondary | ICD-10-CM

## 2020-06-25 NOTE — Progress Notes (Signed)
[facility]  Patient Name: MONCHEL POLLITT MRN: [id number] Date of Birth: 10-14-1940 Age: 79 y.o. Education: 16 years  Measurement properties of test scores: IQ, Index, and Standard Scores (SS): Mean = 100; Standard Deviation = 15 Scaled Scores (Ss): Mean = 10; Standard Deviation = 3 Z scores (Z): Mean = 0; Standard Deviation = 1 T scores (T); Mean = 50; Standard Deviation = 10  TEST SCORES:    Note: This summary of test scores accompanies the interpretive report and should not be interpreted by unqualified individuals or in isolation without reference to the report. Test scores are relative to age, gender, and educational history as available and appropriate.   Performance Validity        "A" Random Letter Test Raw  Descriptor      Errors 0 Within Expectation  The Dot Counting Test: 13 Within Expectation      Mental Status Screening     Total Score Descriptor  MMSE 28 MCI      Expected Functioning        Wide Range Achievement Test: Standard/Scaled Score Percentile      Word Reading 103 58      Attention/Processing Speed        Neuropsychological Assessment Battery (Attention Module, Form 1): T-score Percentile      Digits Forward 47 38      Digits Backwards 41 18      Repeatable Battery for the Assessment of Neuropsychological Status (Form A): Scaled Score Percentile      Coding 6 9      Language        Neuropsychological Assessment Battery (Language Module, Form 1): T-score Percentile      Naming   (26) 36 8      Verbal Fluency: T-score Percentile      Controlled Oral Word Association (F-A-S) 48 42      Semantic Fluency (Animals) 27 1      Memory:        Neuropsychological Assessment Battery (Memory Module, Form 1): T-score Percentile      List Learning           List A Immediate Recall   (2, 4, 4) 20 <1         List B Immediate Recall   (1) 28 2         List A Short Delayed Recall   (0) 19 <1         List A Long  Delayed Recall   (0) 24 <1         List A Percent Retention   (0 %) --- <1         List A Long Delayed Yes/No Recognition Hits   (10) --- 31         List A Long Delayed Yes/No Recognition False Alarms   (11) --- 8         List A Recognition Discriminability Index --- 4     Story Learning           Immediate Recall   (10, 11) 19 <1         Delayed Recall   (0) 25 1         Percent Retention   (0 %) --- <1      Daily Living Memory            Immediate Recall   (19, 8) 31 3  Delayed Recall   (1, 0) 19 <1          Percent Retention (10 %) --- <1          Recognition Hits    (5) --- 2      Repeatable Battery for the Assessment of Neuropsychological Status (Form A): Scaled Score Percentile         Figure Recall   (0) 1 <1      Visuospatial/Constructional Functioning        Repeatable Battery for the Assessment of Neuropsychological Status (Form A): Standard/Scaled Score Percentile     Visuospatial/Constructional Index 112 79         Figure Copy   (18) 11 63         Judgment of Line Orientation   (18) --- >75      Executive Functioning        Modified [place] Card Sorting Test (MWCST): Standard/T-Score Percentile      Number of Categories Correct 28 2      Number of Perseverative Errors 28 2      Number of Total Errors 24 <1      Percent Perseverative Errors 40 16  Executive Function Composite 64 1      Trail Making Test: T-Score Percentile      Part A 55 69      Part B 37 9      [place] Diagnostic Aphasia Exam: Raw Score Scaled Score      Complex Ideational Material 11 9      Clock Drawing Raw Score Descriptor      Command 7 Mild Impairment      Rating Scales        Clinical Dementia Rating Raw Score Descriptor      Sum of Boxes 4.0 Very Mild Dementia      Global Score 0.5 MCI      Quick Dementia Rating System Raw Score Descriptor      Sum of Boxes 3.5 Very Mild Dementia      Total Score 4.5 MCI  Geriatric Depression Scale - Short Form 5 Negative   Chauncey Sciulli V.  Nicole Kindred PsyD, [place] Clinical Neuropsychologist

## 2020-06-25 NOTE — Progress Notes (Signed)
[facility]  Patient Name: Chelsea Taylor MRN: [id number] Date of Birth: 04-Oct-1940 Age: 79 y.o. Education: 16 years  Referral Circumstances and Background Information  Ms. Chelsea Taylor is a 79 y.o., right-hand dominant, widowed (unfortunately husband passed several weeks ago) woman with a history of HTN, hypothyroidism, and memory and thinking problems noticed by her family in August, 2021, although they have likely been going on longer than that with a MoCA of 19/30 at her last appointment. She was seen by Dr. Delice Lesch and was referred in the service of diagnostic clarity and long-term care planning. She lives alone.    On interview, the patient reported that she has some memory and thinking problems but she does not think they are in excess of expectations for a person her age. Her daughter on the other hand is somewhat concerned, there was an episode in July where she had to call the paper and forgot her phone number, forgot her address, and handed the phone to her daughter to deal with it, which concerned her. The patient's daughter didn't notice any changes prior to that, although she hasn't seen her since 2019 related to [place]. She has also noticed problems with organizing medications, it took her "literally an hour" to organize her medications recently and so she called Dr. Drema Dallas' office to get in home health. On specific review of symptoms, they reported some repeating and forgetting of things that are said but there is no rapid forgetting of information. There is no prominent disorientation and they do not notice any significant word finding problems. The patient is rationalizing her problems today and saying that all her friends also have memory problems. The patient's husband passed recently, but when I asked about it, she did not present as particularly sad. She stated that she has felt "overwhelmed" with all the things she needs to do, but it sounds  like her daughter is handling most of them and she is minimally involved. She denied feeling down, depressed, or sad. She reported that her energy is "not too great," although she is not sleeping during the day. She hasn't been eating quite as much lately although she is still a healthy weight. She was vague regarding her sleep, it sounds like she doesn't sleep well, only 5 or 6 hours per night.   With respect to functioning, much in unknown, because the patient's daughter wasn't involved until recently and her husband was managing many things. He was taking care of the finances, although she did have some credit cards and she was paying them on time. Her daughter is debating putting them on autopay although she is "a little leary" about it. Her husband has also frozen her credit, which makes me wonder if she had some inappropriate usage he was trying to prevent. The patient stated that she is still driving. Her daughter has driven with her and they have been using GPS, but she does note some confusion about where they are. She does not notice any problems with lane placement, obeying traffic signals, or the like. The patient has never been good with phones, the computer, or the like and she continues to have difficulties with that. The patient doesn't cook much, she prepares some basic things like mac n cheese, and she hasn't had any problems or left the burner on. She keeps the house up to some extent, but her daughter thinks she needs help, they have a large house and it is physically challenging for  her. They are arranging for a cleaning service and she did have a cleaning service prior to the pandemic. She is going to get home health to help with her medications. It's not clear what she is doing during the day or for hobbies, she said she is a reader but then admitted that she hasn't read in a while. Her daughter says she has been doing a lot of puzzles and word searches and the like. She has minimal social  contact, but she does talk to people and friends came by after her husband passed.    Past Medical History and Review of Relevant Studies   Patient Active Problem List   Diagnosis Date Noted  . Bilateral impacted cerumen 11/29/2017  . Presbycusis of both ears 11/29/2017  . Pleural effusion 06/20/2017  . Microscopic colitis 06/01/2017  . Acute respiratory failure ([place]) 05/31/2017  . Essential hypertension 05/31/2017  . Hypothyroidism 05/31/2017  . Varicose veins of lower extremities with complications 64/40/3474  . Edema 06/03/2014  . Restless legs syndrome (RLS) 02/28/2013    Review of Neuroimaging and Relevant Medical History: Patient has an MRI of the brain that has not yet been obtained, scheduled for 06/30/2020.   Nonfocal essentially normal neurological examination with no signs of Parkinsonism.   Current Outpatient Medications  Medication Sig Dispense Refill  . alendronate (FOSAMAX) 70 MG tablet     . aspirin 81 MG tablet Take 81 mg by mouth at bedtime.     . bisoprolol (ZEBETA) 5 MG tablet Take 1 tablet (5 mg total) by mouth daily. 30 tablet 6  . calcium gluconate 500 MG tablet Take 500 mg by mouth 2 (two) times daily.     . celecoxib (CELEBREX) 200 MG capsule Take by mouth 2 (two) times daily.    . Cholecalciferol (VITAMIN D) 2000 units CAPS Take 2,000 Units by mouth daily.     . folic acid (FOLVITE) 259 MCG tablet Take 800 mcg by mouth at bedtime.     . furosemide (LASIX) 20 MG tablet Take 60 mg by mouth daily.    Marland Kitchen levothyroxine (SYNTHROID, LEVOTHROID) 125 MCG tablet Take 125 mcg by mouth daily before breakfast.    . losartan (COZAAR) 100 MG tablet     . losartan (COZAAR) 50 MG tablet Take 50 mg by mouth daily.    . Multiple Vitamin (MULTIVITAMIN) tablet Take 1 tablet by mouth daily.    . naproxen sodium (ALEVE) 220 MG tablet Take 220 mg by mouth as needed.     . Red Yeast Rice 600 MG CAPS Take 600 mg by mouth 2 (two) times daily.    Marland Kitchen rOPINIRole (REQUIP) 1 MG tablet  Take 2 mg by mouth. 2 mg  tablets in afternoon and 3 mg tablets at bedtime    . sulfaSALAzine (AZULFIDINE) 500 MG EC tablet Take 1,500 mg by mouth 2 (two) times daily.     No current facility-administered medications for this visit.  Patient is on the potentially cognitively impairing medications: Ropinirole  Family History  Problem Relation Age of Onset  . Heart failure Mother   . Heart disease Mother   . Hyperlipidemia Mother   . Heart attack Mother   . Colon cancer Father   . Cancer Father   . Hypertension Father   . Heart attack Brother   . Heart disease Brother   . Diabetes Daughter    There is no  family history of dementia. There is no  family history of psychiatric illness.  Psychosocial History  Developmental, Educational and Employment History: The patient is a native of [place]. The patient reported that she did very well in school, she was never held held back and didn't have any learning problems. She skipped a grade. She went on to pursue a degree in accounting from [employer] in [place]. 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 She had a hard time telling me what positions she had or where she worked. She retired early, at 89, and they moved here when her husband retired about 25 years ago.   Psychiatric History: Denied by the patient.   Substance Use History: The patient denied any alcohol use or tobacco/nicotine use. She doesn't use any illicit substances.   Relationship History and Living Cimcumstances: The patient and her husband were married for 44 years. They have one daughter, Anderson Malta who lives in [place].   Mental Status and Behavioral Observations  Sensorium/Arousal: The patient's level of arousal was awake and alert. Hearing and vision were adequate for testing purposes. Orientation: The patient was fully oriented to person, place, time, and situation. Unclear about the specific floor.  Appearance: The  patient was wearing casual sweat clothing with adequate grooming and hygiene.  Behavior: Appropriate, pleasant, presented as quite passive and unconcerned Speech/language: Speech was normal in rate, rhythm, volume, and prosody with no word finding pauses or paraphasic errors.  Gait/Posture: Gait was not formally examined, narrow based and normal for Dr. Delice Lesch at her recent exam.  Movement: The patient had no overt signs/symptoms of movement disorder such as tremor, bradykinesia, hypokinesia Social Comportment: Pleasant and appropriate, often deferred to daughter Mood: "Good" Affect: Mainly neutral Thought process/content: Logical and goal oriented for the most part, although often at a loss about information that was enquired. Content was appropriate to the evaluative context.  Safety: No safety concerns were identified at the present visit.  Insight: Fair   MMSE - Mini Mental State Exam 06/25/2020  Orientation to time 5  Orientation to Place 5  Registration 3  Attention/ Calculation 5  Recall 1  Language- name 2 objects 2  Language- repeat 1  Language- follow 3 step command 3  Language- read & follow direction 1  Write a sentence 1  Copy design 1  Total score 28   Test Procedures  Wide Range Achievement Test - 4             Word Reading Neuropsychological Assessment Battery  List Learning  Story Learning  Daily Living Memory  Naming  Digit Span Repeatable Battery for the Assessment of Neuropsychological Status (Form A)  Figure Copy  Judgment of Line Orientation  Coding  Figure Recall The Dot Counting Test A Random Letter Test Controlled Oral Word Association (F-A-S) Semantic Fluency (Animals) Trail Making Test A & B Complex Ideational Material Modified [place] Card Sorting Test Geriatric Depression Scale - Short Form Quick Dementia Rating System (completed by patient's daughter, Anderson Malta)  Plan  JOHNICE RIEBE was seen for a psychiatric diagnostic evaluation and  neuropsychological testing. She is a pleasant, 79 year old, right-hand dominant woman with a history of memory and thinking problems noticed just in July, 2021. Her daughter was not very involved because she lives out of the area and the patient's husband was doing finances and many other complex tasks. They are reporting some minor memory loss and also confusion on the phone. The patient's husband is recently deceased and her daughter had to essentially take over arranging things,  putting finances back into the patient's name. She is screening a 28/30 on the MMSE today but she has a college education and was in the dementia range (19/30) on the MoCA about two weeks ago with Dr. Delice Lesch. She has an MRI brain upcoming. Full and complete note with impressions, recommendations, and interpretation of test data to follow.   Viviano Simas Nicole Kindred, PsyD, [place] Clinical Neuropsychologist  Informed Consent and Coding/Compliance  Risks and benefits of the evaluation were discussed with the patient prior to all testing procedures. I conducted a clinical interview and neuropsychological testing (at least two tests) with Armond Hang and Lamar Benes, B.S. (Technician) administered additional test procedures. The patient was able to tolerate the testing procedures and the patient (and/or family if applicable) is likely to benefit from further follow up to receive the diagnosis and treatment recommendations, which will be rendered at the next encounter. Billing below reflects technician time, my direct face-to-face time with the patient, time spent in test administration, and time spent in professional activities including but not limited to: neuropsychological test interpretation, integration of neuropsychological test data with clinical history, report preparation, treatment planning, care coordination, and review of diagnostically pertinent medical history or studies.   Services associated with this  encounter: Clinical Interview [phone]) plus 60 minutes (59093; Neuropsychological Evaluation by Professional)  110 minutes (11216; Neuropsychological Evaluation by Professional, Adl.) 28 minutes (24469; Test Administration by Professional) 30 minutes (50722; Neuropsychological Testing by Technician) 65 minutes (57505; Neuropsychological Testing by Technician, Adl.)

## 2020-06-25 NOTE — Progress Notes (Signed)
   Psychometrist Note   Cognitive testing was administered to Chelsea Taylor by Lamar Benes, B.S. (Technician) under Chelsea supervision of Alphonzo Severance, Psy.D., ABN. Chelsea Taylor was able to tolerate all test procedures. Dr. Nicole Kindred met with Chelsea patient as needed to manage any emotional reactions to Chelsea testing procedures. Rest breaks were offered.    Chelsea battery of tests administered was selected by Dr. Nicole Kindred with consideration to Chelsea patient's current level of functioning, Chelsea nature of her symptoms, emotional and behavioral responses during Chelsea interview, level of literacy, observed level of motivation/effort, and Chelsea nature of Chelsea referral question. This battery was communicated to Chelsea psychometrist. Communication between Dr. Nicole Kindred and Chelsea psychometrist was ongoing throughout Chelsea evaluation and Dr. Nicole Kindred was immediately accessible at all times. Dr. Nicole Kindred provided supervision to Chelsea technician on Chelsea date of this service, to Chelsea extent necessary to assure Chelsea quality of all services provided.    Chelsea Taylor will return in approximately one week for an interactive feedback session with Dr. Nicole Kindred, at which time test performance, clinical impressions, and treatment recommendations will be reviewed in detail. Chelsea patient understands she can contact our office should she require our assistance before this time.   A total of 95 minutes of billable time were spent with Chelsea Taylor by Chelsea technician, including test administration and scoring time. Billing for these services is reflected in Dr. Les Pou note.   This note reflects time spent with Chelsea psychometrician and does not include test scores, clinical history, or any interpretations made by Dr. Nicole Kindred. Chelsea full report will follow in a separate note.

## 2020-06-28 NOTE — Progress Notes (Signed)
[facility]  Patient Name: Chelsea Taylor MRN: [id number] Date of Birth: 22-Mar-1941 Age: 79 y.o. Education: 21 years  Clinical Impressions  Chelsea Taylor is a 79 y.o., right-hand dominant, widowed woman with a history of memory and thinking problems that were just noticed earlier in July, 2021. Her daughter hadn't seen her since 2019 related to the Pandemic but has become more involved since the patient's husband recently passed. She noted an incident where the patient had to give her phone and address to the newspaper related to a subscription and forgot both. She also forgot where her daughter has lived, despite the fact that she has lived there much of her life. She has noticed some minor memory problems and it sounds as though the patient has not been able to be very involved in transferring the finances and other things to her name, which her husband used to manage. She has no neuroimaging on file but she has an MRI scheduled. MMSE on exam today was 28/30 and her MoCA was 19/30 just about two weeks ago with Dr. Delice Lesch.    On neuropsychological testing, Chelsea Taylor is demonstrating findings fairly classic for typical cognitive presentations of Alzheimer's clinical syndrome, including memory storage problems, naming problems, and diminished semantic relative to phonemic fluency. She had additional low findings on alternating sequencing of numbers and letters and [employer], suggesting some executive involvement.  She performed relatively better on measures of visuospatial function, attention, and working memory. She screened negative for the presence of depression and was characterized as functioning anywhere from an MCI to very mild dementia level by her daughter. Her CDR is a Sum of Boxes of at least 4, which places her in the mild dementia range.   Chelsea Taylor likely has dementia due to Alzheimer's disease, currently at a mild level of  progression. There could be additional vascular or other contributions. Her MRI is pending. Could consider starting antidementia medication. She and her daughter will be counseled regarding the help that she likely requires, as it may not be ideal for her to live independently many hours from the nearest family members. They are not noticing any problems with driving at present but that should be carefully monitored. Recommend that she continue to receive help as needed with management of finances and other memory dependent activities. She can return for evaluation in 1 to 2 years to follow her progress.   Diagnostic Impressions: Alzheimer's dementia, late onset, mild, without behavioral disturbance  Recommendations to be discussed with patient  Your performance and presentation on assessment was consistent with cognitive problems including memory storage problems, naming problems, and other findings indicating impaired word retrieval. There were also some low executive test scores. In conjunction with your clinical history and the fact that you are requiring some assistance with things such as finances, I think you have reached a dementia level of function.   Dementia refers to a group of syndromes where multiple areas of ability are damaged in the brain, such as memory, thinking, judgment, and behavior, and most commonly refers to age related causes of dementia that cause worsening in these abilities over time. Alzheimer's disease is the most common form of dementia in people over the age of 64. Not all dementias are Alzheimer's disease, but all Alzheimer's disease is dementia. When dementia is due to an underlying condition affecting the brain, such as Alzheimer's disease, there is progression over time, which typically proceeds gradually over many years.  In your case, I am fairly confident that your dementia is due to Alzheimer's disease, as you have Alzheimer's clinical syndrome (the set of signs  and symptoms that typically result from this pathology). This means that there will be progression over time.   I think that you may need more help than is realized at this point in time. Often time, individuals with Alzheimer's disease have difficulty reporting their needs and have difficulty doing things that, on the surface, appear as though they would be easy for them. It is difficult to tell for sure because your husband was managing many things previously, but I think it is unlikely that you can be fully independent with managing finances and medications, for example, and I would recommend that you get help with those things.   While I would encourage you to wait to make the decision, I am at least somewhat concerned about you living hundreds of miles from the nearest family member, because it will make it more challenging to get help if help is needed. .   Individuals with Alzheimer's disease often have a hard time making decisions that are required in the service of daily living, such as planning activities for the day, planning and executive healthy nutritious meals, and making sure to engage in good self-care practices. Setting up a schedule of meaningful activities that includes things such as meals and other necessary tasks is recommended, because when things are routinized there are less demands on you to make decisions.   There are medications that can potentially be helpful with Alzheimer's, such as cholinesterase inhibitors. You can discuss medication at your next appointment with Dr. Delice Lesch. I would like to be clear that the benefit experienced with medication is typically mild and that it does not alter underlying disease progression and thus, there is no rush to get on medication.   There is now good quality evidence from at least one large scale study that a modified mediterranean diet may help slow cognitive decline. This is known as the "MIND" diet. The Mind diet is not so much a  specific diet as it is a set of recommendations for things that you should and should not eat.   Foods that are ENCOURAGED on the MIND Diet:  Green, leafy vegetables: Aim for six or more servings per week. This includes kale, spinach, cooked greens and salads.  All other vegetables: Try to eat another vegetable in addition to the green leafy vegetables at least once a day. It is best to choose non-starchy vegetables because they have a lot of nutrients with a low number of calories.  Berries: Eat berries at least twice a week. There is a plethora of research on strawberries, and other berries such as blueberries, raspberries and blackberries have also been found to have antioxidant and brain health benefits.  Nuts: Try to get five servings of nuts or more each week. The creators of the [place] don't specify what kind of nuts to consume, but it is probably best to vary the type of nuts you eat to obtain a variety of nutrients. Peanuts are a legume and do not fall into this category.  Olive oil: Use olive oil as your main cooking oil. There may be other heart-healthy alternatives such as algae oil, though there is not yet sufficient research upon which to base a formal recommendation.  Whole grains: Aim for at least three servings daily. Choose minimally processed grains like oatmeal, quinoa, brown rice, whole-wheat pasta and 100%  whole-wheat bread.  Fish: Eat fish at least once a week. It is best to choose fatty fish like salmon, sardines, trout, tuna and mackerel for their high amounts of omega-3 fatty acids.  Beans: Include beans in at least four meals every week. This includes all beans, lentils and soybeans.  Poultry: Try to eat chicken or [place] at least twice a week. Note that fried chicken is not encouraged on the MIND diet.  Wine: Aim for no more than one glass of alcohol daily. Both red and white wine may benefit the brain. However, much research has focused on the red wine compound  resveratrol, which may help protect against Alzheimer's disease.  Foods that are DISCOURAGED on the MIND Diet: Butter and margarine: Try to eat less than 1 tablespoon (about 14 grams) daily. Instead, try using olive oil as your primary cooking fat, and dipping your bread in olive oil with herbs.  Cheese: The MIND diet recommends limiting your cheese consumption to less than once per week.  Red meat: Aim for no more than three servings each week. This includes all beef, pork, lamb and products made from these meats.  Mace[place] Pro food: The MIND diet highly discourages fried food, especially the kind from fast-food restaurants. Limit your consumption to less than once per week.  Pastries and sweets: This includes most of the processed junk food and desserts you can think of. Ice cream, cookies, brownies, snack cakes, donuts, candy and more. Try to limit these to no more than four times a week.  Exercise is one of the best medicines for promoting health and maintaining cognitive fitness at all stages in life. Exercise probably has the largest documented effect on brain health and performance of any lifestyle intervention. Studies have shown that even previously sedentary individuals who start exercising as late as age 34 show a significant survival benefit as compared to their non-exercising peers. In the [place], the current guidelines are for 30 minutes of moderate exercise per day, but increasing your activity level less than that may also be helpful. You do not have to get your 30 minutes of exercise in one shot and exercising for short periods of time spread throughout the day can be helpful. Go for several walks, learn to dance, or do something else you enjoy that gets your body moving. Of course, if you have an underlying medical condition or there is any question about whether it is safe for you to exercise, you should consult a medical treatment provider prior to beginning exercise.   If this is  Alzheimer's, then it will eventually become unsafe for you to drive. This should be carefully monitored.   Test Findings  Test scores are summarized in additional documentation associated with this encounter. Test scores are relative to age, gender, and educational history as available and appropriate. There were no concerns about performance validity as all findings fell within normal expectations.   General Intellectual Functioning/Achievement:  Performance on single word reading was average, which presents as a reasonable standard of comparison for this patient's cognitive test performance.   Attention and Processing Efficiency: Indicators of attention and memory generated reasonable scores with average digit repetition forward and low average digit repetition backward.   On timed indicators of processing efficiency, performance was low for more complex tasks with unusually low timed number symbol coding. Simple numeric sequencing, on the other hand, was average.   Language: Performance on language measures was suggestive of word retrieval problems with unusually low visual object  confrontation naming and extremely low generation of animals in one minute. Generation of words in response to the letters F-A-S was better and fell at an average level.   Visuospatial Function: Performance was normal on measures of visuospatial and construcive abilities with an average score on the overall index. Figure copy was average and judgment of angular line orientations was high average.   Learning and Memory: Measures of learning and memory generated extremely low scores nearly across the board, with a pattern highly concerning for storage problems.   In the verbal realm, Chelsea Taylor learned 2, 4, and 4 words of a 12-item word list followed by 0 words at short and long delayed recall. Her recognition discriminability was low, with more false positive errors than correct identifications. Memory for a short  story was low with a flat learning curve and then no information recalled. Daily living memory was unusually low on immediate recall but her retention across time was extremely low and delayed recall was extremely low. Delayed recognition of the information was also unusually to extremely low.   In the visual realm, delayed recall of a modestly complex figure was extremely low (did not recall any figure details).   Executive Functions: Performance on indicators of executive abilities suggested some impairment with an extremely low Therapist, music on the [employer] and extremely low scores for both categories correct and perseverative errors. She demonstrated weak low average (almost unusually low) performance on alternating sequencing of numbers and letters of the alphabet. Reasoning with verbal information was better on the [employer], with an average score. Clock drawing suggested "mild impairment" with slight inaccuracies in number placement and then hands markedly out of course at 10:50.   Rating Scale(s): Chelsea Taylor screened negative for the presence of depression and if anything, presented as though she was less upset than might be expected given the loss of her spouse. She was characterized as functioning at an MCI to very mild dementia level of dysfunction by her daughter on the QDRS. I rated a CDR for her and her score is at least 4 on the Sum of Boxes, which is mild dementia.   Viviano Simas Nicole Kindred PsyD, [place] Clinical Neuropsychologist

## 2020-06-30 ENCOUNTER — Other Ambulatory Visit: Payer: Self-pay

## 2020-06-30 ENCOUNTER — Telehealth: Payer: Self-pay

## 2020-06-30 ENCOUNTER — Ambulatory Visit
Admission: RE | Admit: 2020-06-30 | Discharge: 2020-06-30 | Disposition: A | Discharge status: Home / Self Care | Payer: Medicare Other | Source: Ambulatory Visit | Attending: Neurology | Admitting: Neurology

## 2020-06-30 DIAGNOSIS — G3184 Mild cognitive impairment, so stated: Secondary | ICD-10-CM

## 2020-06-30 NOTE — Telephone Encounter (Signed)
-----   Message from Cameron Sprang, MD sent at 06/30/2020 12:23 PM EST ----- Pls let her know the MRI brain did not show any evidence of tumor, new stroke, or bleed. It showed age-related changes. Thanks

## 2020-06-30 NOTE — Telephone Encounter (Signed)
Pt called to go over MRI results no answer left voice mail to call the office back

## 2020-07-05 ENCOUNTER — Other Ambulatory Visit: Payer: Self-pay

## 2020-07-05 ENCOUNTER — Encounter: Payer: Self-pay | Admitting: Counselor

## 2020-07-05 ENCOUNTER — Ambulatory Visit (INDEPENDENT_AMBULATORY_CARE_PROVIDER_SITE_OTHER): Payer: Medicare Other | Admitting: Counselor

## 2020-07-05 DIAGNOSIS — F0280 Dementia in other diseases classified elsewhere without behavioral disturbance: Secondary | ICD-10-CM

## 2020-07-05 DIAGNOSIS — G301 Alzheimer's disease with late onset: Secondary | ICD-10-CM | POA: Diagnosis not present

## 2020-07-05 NOTE — Patient Instructions (Signed)
Your performance and presentation on assessment was consistent with cognitive problems including memory storage problems, naming problems, and other findings indicating impaired word retrieval. There were also some low executive test scores. In conjunction with your clinical history and the fact that you are requiring some assistance with things such as finances, I think you have reached a dementia level of function.   Dementia refers to a group of syndromes where multiple areas of ability are damaged in the brain, such as memory, thinking, judgment, and behavior, and most commonly refers to age related causes of dementia that cause worsening in these abilities over time. Alzheimer's disease is the most common form of dementia in people over the age of 8. Not all dementias are Alzheimer's disease, but all Alzheimer's disease is dementia. When dementia is due to an underlying condition affecting the brain, such as Alzheimer's disease, there is progression over time, which typically proceeds gradually over many years.   In your case, I am fairly confident that your dementia is due to Alzheimer's disease, as you have Alzheimer's clinical syndrome (the set of signs and symptoms that typically result from this pathology). This means that there will be progression over time.   I think that you may need more help than is realized at this point in time. Often time, individuals with Alzheimer's disease have difficulty reporting their needs and have difficulty doing things that, on the surface, appear as though they would be easy for them. It is difficult to tell for sure because your husband was managing many things previously, but I think it is unlikely that you can be fully independent with managing finances and medications, for example, and I would recommend that you get help with those things.   While I would encourage you to wait to make the decision, I am at least somewhat concerned about you living  hundreds of miles from the nearest family member, because it will make it more challenging to get help if help is needed. .   Individuals with Alzheimer's disease often have a hard time making decisions that are required in the service of daily living, such as planning activities for the day, planning and executive healthy nutritious meals, and making sure to engage in good self-care practices. Setting up a schedule of meaningful activities that includes things such as meals and other necessary tasks is recommended, because when things are routinized there are less demands on you to make decisions.   There are medications that can potentially be helpful with Alzheimer's, such as cholinesterase inhibitors. You can discuss medication at your next appointment with Dr. Delice Lesch. I would like to be clear that the benefit experienced with medication is typically mild and that it does not alter underlying disease progression and thus, there is no rush to get on medication.   There is now good quality evidence from at least one large scale study that a modified mediterranean diet may help slow cognitive decline. This is known as the "MIND" diet. The Mind diet is not so much a specific diet as it is a set of recommendations for things that you should and should not eat.   Foods that are ENCOURAGED on the MIND Diet:  Green, leafy vegetables: Aim for six or more servings per week. This includes kale, spinach, cooked greens and salads.  All other vegetables: Try to eat another vegetable in addition to the green leafy vegetables at least once a day. It is best to choose non-starchy vegetables because they have  a lot of nutrients with a low number of calories.  Berries: Eat berries at least twice a week. There is a plethora of research on strawberries, and other berries such as blueberries, raspberries and blackberries have also been found to have antioxidant and brain health benefits.  Nuts: Try to get five servings  of nuts or more each week. The creators of the [place] don't specify what kind of nuts to consume, but it is probably best to vary the type of nuts you eat to obtain a variety of nutrients. Peanuts are a legume and do not fall into this category.  Olive oil: Use olive oil as your main cooking oil. There may be other heart-healthy alternatives such as algae oil, though there is not yet sufficient research upon which to base a formal recommendation.  Whole grains: Aim for at least three servings daily. Choose minimally processed grains like oatmeal, quinoa, brown rice, whole-wheat pasta and 100% whole-wheat bread.  Fish: Eat fish at least once a week. It is best to choose fatty fish like salmon, sardines, trout, tuna and mackerel for their high amounts of omega-3 fatty acids.  Beans: Include beans in at least four meals every week. This includes all beans, lentils and soybeans.  Poultry: Try to eat chicken or [place] at least twice a week. Note that fried chicken is not encouraged on the MIND diet.  Wine: Aim for no more than one glass of alcohol daily. Both red and white wine may benefit the brain. However, much research has focused on the red wine compound resveratrol, which may help protect against Alzheimer's disease.  Foods that are DISCOURAGED on the MIND Diet: Butter and margarine: Try to eat less than 1 tablespoon (about 14 grams) daily. Instead, try using olive oil as your primary cooking fat, and dipping your bread in olive oil with herbs.  Cheese: The MIND diet recommends limiting your cheese consumption to less than once per week.  Red meat: Aim for no more than three servings each week. This includes all beef, pork, lamb and products made from these meats.  Mace[employer] Pro food: The MIND diet highly discourages fried food, especially the kind from fast-food restaurants. Limit your consumption to less than once per week.  Pastries and sweets: This includes most of the processed junk food and desserts  you can think of. Ice cream, cookies, brownies, snack cakes, donuts, candy and more. Try to limit these to no more than four times a week.  Exercise is one of the best medicines for promoting health and maintaining cognitive fitness at all stages in life. Exercise probably has the largest documented effect on brain health and performance of any lifestyle intervention. Studies have shown that even previously sedentary individuals who start exercising as late as age 60 show a significant survival benefit as compared to their non-exercising peers. In the [place], the current guidelines are for 30 minutes of moderate exercise per day, but increasing your activity level less than that may also be helpful. You do not have to get your 30 minutes of exercise in one shot and exercising for short periods of time spread throughout the day can be helpful. Go for several walks, learn to dance, or do something else you enjoy that gets your body moving. Of course, if you have an underlying medical condition or there is any question about whether it is safe for you to exercise, you should consult a medical treatment provider prior to beginning exercise.   If this  is Alzheimer's, then it will eventually become unsafe for you to drive. This should be carefully monitored.

## 2020-07-05 NOTE — Progress Notes (Addendum)
NEUROPSYCHOLOGY FEEDBACK NOTE [facility]  Feedback Note: I met with Chelsea Taylor to review the findings resulting from her neuropsychological evaluation. Since the last appointment, she has been about the same. She attended with her close friend, Chelsea Taylor, from her [employer]. Chelsea Taylor is in communication with her daughter. Time was spent reviewing the impressions and recommendations that are detailed in the evaluation report. We discussed impression of mild dementia due to Alzheimer's disease, as reflected in the patient instructions. I expressed my concerns about the longevity of her living situation and counseled her on support that is typically needed and course of progression expected. I think that she is likely fine to live independently although I worry that family are not available in the event of an emergency and she does live alone. We also discussed dietary and exercise strategies for managing dementia. I took time to explain the findings and answer all the patient's questions. I encouraged Chelsea Taylor to contact me should she have any further questions or if further follow up is desired.   Current Medications and Medical History   Current Outpatient Medications  Medication Sig Dispense Refill  . alendronate (FOSAMAX) 70 MG tablet     . aspirin 81 MG tablet Take 81 mg by mouth at bedtime.     . bisoprolol (ZEBETA) 5 MG tablet Take 1 tablet (5 mg total) by mouth daily. 30 tablet 6  . calcium gluconate 500 MG tablet Take 500 mg by mouth 2 (two) times daily.     . celecoxib (CELEBREX) 200 MG capsule Take by mouth 2 (two) times daily.    . Cholecalciferol (VITAMIN D) 2000 units CAPS Take 2,000 Units by mouth daily.     . folic acid (FOLVITE) 038 MCG tablet Take 800 mcg by mouth at bedtime.     . furosemide (LASIX) 20 MG tablet Take 60 mg by mouth daily.    Marland Kitchen levothyroxine (SYNTHROID, LEVOTHROID) 125 MCG tablet Take 125 mcg by mouth daily before breakfast.    . losartan (COZAAR)  100 MG tablet     . losartan (COZAAR) 50 MG tablet Take 50 mg by mouth daily.    . Multiple Vitamin (MULTIVITAMIN) tablet Take 1 tablet by mouth daily.    . naproxen sodium (ALEVE) 220 MG tablet Take 220 mg by mouth as needed.     . Red Yeast Rice 600 MG CAPS Take 600 mg by mouth 2 (two) times daily.    Marland Kitchen rOPINIRole (REQUIP) 1 MG tablet Take 2 mg by mouth. 2 mg  tablets in afternoon and 3 mg tablets at bedtime    . sulfaSALAzine (AZULFIDINE) 500 MG EC tablet Take 1,500 mg by mouth 2 (two) times daily.     No current facility-administered medications for this visit.    Patient Active Problem List   Diagnosis Date Noted  . Bilateral impacted cerumen 11/29/2017  . Presbycusis of both ears 11/29/2017  . Pleural effusion 06/20/2017  . Microscopic colitis 06/01/2017  . Acute respiratory failure ([place]) 05/31/2017  . Essential hypertension 05/31/2017  . Hypothyroidism 05/31/2017  . Varicose veins of lower extremities with complications 88/28/0034  . Edema 06/03/2014  . Restless legs syndrome (RLS) 02/28/2013    Mental Status and Behavioral Observations  Chelsea Taylor presented on time to the present encounter and was alert and oriented to time. Speech was normal in rate, rhythm, volume, and prosody. Self-reported mood was "pretty good" and affect was mainly euthymic. Thought process was circumstantial, she would often bring  up things that were unrelated to the topics at hand and thought content was appropriate. There were no safety concerns identified at today's encounter, such as thoughts of harming self or others.   Plan  Feedback provided regarding the patient's neuropsychological evaluation. She has mild dementia likely due to AD, with perhaps a more minor vascular contributor. I called her daughter and attempted to involve her, but she was unavailable. I suggested they set up another appointment if they wish for further discussion or her daughter has questions after the content we discussed  today is related to her. Chelsea Taylor was encouraged to contact me if any questions arise or if further follow up is desired.   Chelsea Simas Nicole Kindred, PsyD, ABN Clinical Neuropsychologist  Service(s) Provided at This Encounter: 18 minutes [phone]; Individual psychotherapy)

## 2020-07-06 ENCOUNTER — Telehealth: Payer: Self-pay

## 2020-07-06 NOTE — Telephone Encounter (Signed)
Pt called and informed that MRI brain did not show any evidence of tumor, new stroke, or bleed. It showed age-related changes

## 2020-07-06 NOTE — Telephone Encounter (Signed)
-----   Message from Cameron Sprang, MD sent at 06/30/2020 12:23 PM EST ----- Pls let her know the MRI brain did not show any evidence of tumor, new stroke, or bleed. It showed age-related changes. Thanks

## 2020-07-26 ENCOUNTER — Encounter: Payer: Medicare Other | Admitting: Counselor

## 2020-08-02 ENCOUNTER — Encounter: Payer: Medicare Other | Admitting: Counselor

## 2020-08-16 ENCOUNTER — Telehealth: Payer: Self-pay | Admitting: Counselor

## 2020-08-16 NOTE — Telephone Encounter (Signed)
The CPT code for her follow up encounter is [phone], conjoint therapy, which describes the nature of the service I provided connected with her diagnosis of Alzheimer's dementia, late onset, without behavioral disturbance. The diet that I recommended is the [facility] diet, and detailed instructions on how to implement the diet are available in her feedback note under "patient instructions." I am unaware of any evidence suggesting nutrisystem diet is helpful for cognitive problems or dementia prophylaxis.

## 2020-08-16 NOTE — Telephone Encounter (Signed)
Returned call to the patient's daughter and left a message for her to call back in.

## 2020-08-16 NOTE — Telephone Encounter (Signed)
Patient's daughter called in stating the insurance told her the incorrect CPT code was billed for the diagnosis code  for her Neuropsych results visit. Could you double check that CPT code? Also she had a question about the [place] you suggested. She was thinking of Nutrisystem, but saw there was a lot of sugar with it. She would like your opinion on this diet?

## 2020-08-17 NOTE — Telephone Encounter (Signed)
Thanks Sarah, I will stand by.

## 2020-08-17 NOTE — Telephone Encounter (Signed)
Per Dr Nicole Kindred: he would be happy to resubmit the visit and take his best guess at the CPT code that the insurance company would accept. If patient's daughter would like to try to find out from the insurance company the code they are looking for or would accept, she can call us back to notify. He will only resubmit once more, so let us know which option she would like to choose. He also stated he is not aware of any mail service food plans he would recommend that she sign patient up for. If she has additional questions, he would be happy to set up another follow up with her to discuss this over the phone with her.  Called patient's daughter to notify of Dr Les Pou reply: She will call us back in a day or so. She would like to try to get the CPT code from the insurance company before Dr Nicole Kindred resubmits the visit. And she states that she will do some research on mail service food plans in her mom's area that seem appropriate with the plan that Dr Nicole Kindred recommended.

## 2020-08-17 NOTE — Telephone Encounter (Signed)
Patient's daughter returned call to [place]. I relayed Dr Les Pou message to her. She asked what she should do from here since insurance isn't accepting that CPT code? She appreciated Dr Les Pou feedback around the nutrisystem diet. She would like to know if Dr Nicole Kindred has any recommendations on mail service food plans that would support the MIND diet he recommended as her mom would need that service.   I let her know that I would relay these message to Dr Nicole Kindred and would return her call when I am able to speak with him. Caller voiced understanding and agreement.

## 2020-08-24 NOTE — Progress Notes (Signed)
Service addended to reflect [phone], which reflects the service provided on the date of the session and was requested by the patient's insurance company for coding purposes.

## 2020-08-24 NOTE — Addendum Note (Signed)
Addended by: Alphonzo Severance on: 08/24/2020 03:53 PM   Modules accepted: Level of Service

## 2020-08-24 NOTE — Telephone Encounter (Signed)
Dr Nicole Kindred, I spoke with [facility]. The agent I spoke with said that if we use CPT code [phone] to resubmit the claim that it should be accepted. I called patient's daughter to confirm with her she is okay with [place] proceeding and she agreed. Let me know if I need to do anything further. Thanks!

## 2020-09-01 DIAGNOSIS — M170 Bilateral primary osteoarthritis of knee: Secondary | ICD-10-CM | POA: Diagnosis not present

## 2020-09-01 DIAGNOSIS — K219 Gastro-esophageal reflux disease without esophagitis: Secondary | ICD-10-CM | POA: Diagnosis not present

## 2020-09-01 DIAGNOSIS — E039 Hypothyroidism, unspecified: Secondary | ICD-10-CM | POA: Diagnosis not present

## 2020-09-01 DIAGNOSIS — E7800 Pure hypercholesterolemia, unspecified: Secondary | ICD-10-CM | POA: Diagnosis not present

## 2020-09-01 DIAGNOSIS — I10 Essential (primary) hypertension: Secondary | ICD-10-CM | POA: Diagnosis not present

## 2020-09-08 DIAGNOSIS — G4733 Obstructive sleep apnea (adult) (pediatric): Secondary | ICD-10-CM | POA: Diagnosis not present

## 2020-09-08 DIAGNOSIS — M170 Bilateral primary osteoarthritis of knee: Secondary | ICD-10-CM | POA: Diagnosis not present

## 2020-09-17 DIAGNOSIS — E7800 Pure hypercholesterolemia, unspecified: Secondary | ICD-10-CM | POA: Diagnosis not present

## 2020-09-17 DIAGNOSIS — M170 Bilateral primary osteoarthritis of knee: Secondary | ICD-10-CM | POA: Diagnosis not present

## 2020-09-17 DIAGNOSIS — E039 Hypothyroidism, unspecified: Secondary | ICD-10-CM | POA: Diagnosis not present

## 2020-09-17 DIAGNOSIS — I10 Essential (primary) hypertension: Secondary | ICD-10-CM | POA: Diagnosis not present

## 2020-09-17 DIAGNOSIS — K219 Gastro-esophageal reflux disease without esophagitis: Secondary | ICD-10-CM | POA: Diagnosis not present

## 2020-09-30 ENCOUNTER — Encounter: Payer: Self-pay | Admitting: Sports Medicine

## 2020-09-30 ENCOUNTER — Other Ambulatory Visit: Payer: Self-pay

## 2020-09-30 ENCOUNTER — Ambulatory Visit (INDEPENDENT_AMBULATORY_CARE_PROVIDER_SITE_OTHER): Payer: Medicare Other | Admitting: Sports Medicine

## 2020-09-30 DIAGNOSIS — B351 Tinea unguium: Secondary | ICD-10-CM

## 2020-09-30 DIAGNOSIS — I739 Peripheral vascular disease, unspecified: Secondary | ICD-10-CM

## 2020-09-30 DIAGNOSIS — M79675 Pain in left toe(s): Secondary | ICD-10-CM | POA: Diagnosis not present

## 2020-09-30 DIAGNOSIS — M79674 Pain in right toe(s): Secondary | ICD-10-CM

## 2020-09-30 NOTE — Progress Notes (Signed)
Subjective: Chelsea Taylor is a 80 y.o. female patient seen today in office with complaint of mildly painful thickened and elongated toenails; unable to trim. No other pedal complaints.  Patient Active Problem List   Diagnosis Date Noted  . Bilateral impacted cerumen 11/29/2017  . Presbycusis of both ears 11/29/2017  . Pleural effusion 06/20/2017  . Microscopic colitis 06/01/2017  . Acute respiratory failure ([place]) 05/31/2017  . Essential hypertension 05/31/2017  . Hypothyroidism 05/31/2017  . Varicose veins of lower extremities with complications 35/36/1443  . Edema 06/03/2014  . Restless legs syndrome (RLS) 02/28/2013    Current Outpatient Medications on File Prior to Visit  Medication Sig Dispense Refill  . alendronate (FOSAMAX) 70 MG tablet     . aspirin 81 MG tablet Take 81 mg by mouth at bedtime.     . bisoprolol (ZEBETA) 5 MG tablet Take 1 tablet (5 mg total) by mouth daily. 30 tablet 6  . calcium gluconate 500 MG tablet Take 500 mg by mouth 2 (two) times daily.     . celecoxib (CELEBREX) 200 MG capsule Take by mouth 2 (two) times daily.    . Cholecalciferol (VITAMIN D) 2000 units CAPS Take 2,000 Units by mouth daily.     . folic acid (FOLVITE) 154 MCG tablet Take 800 mcg by mouth at bedtime.     . furosemide (LASIX) 20 MG tablet Take 60 mg by mouth daily.    Marland Kitchen levothyroxine (SYNTHROID, LEVOTHROID) 125 MCG tablet Take 125 mcg by mouth daily before breakfast.    . losartan (COZAAR) 100 MG tablet     . losartan (COZAAR) 50 MG tablet Take 50 mg by mouth daily.    . Multiple Vitamin (MULTIVITAMIN) tablet Take 1 tablet by mouth daily.    . naproxen sodium (ALEVE) 220 MG tablet Take 220 mg by mouth as needed.     . Red Yeast Rice 600 MG CAPS Take 600 mg by mouth 2 (two) times daily.    Marland Kitchen rOPINIRole (REQUIP) 1 MG tablet Take 2 mg by mouth. 2 mg  tablets in afternoon and 3 mg tablets at bedtime    . sulfaSALAzine (AZULFIDINE) 500 MG EC tablet Take 1,500 mg by mouth 2 (two) times  daily.     No current facility-administered medications on file prior to visit.    Allergies  Allergen Reactions  . Adhesive [Tape]   . Codeine   . Neosporin [Neomycin-Bacitracin Zn-Polymyx]   . Penicillins Rash    Has patient had a PCN reaction causing immediate rash, facial/tongue/throat swelling, SOB or lightheadedness with hypotension: No Has patient had a PCN reaction causing severe rash involving mucus membranes or skin necrosis: No Has patient had a PCN reaction that required hospitalization: No Has patient had a PCN reaction occurring within the last 10 years: No If all of the above answers are "NO", then may proceed with Cephalosporin use.    Objective: Physical Exam  General: Well developed, nourished, no acute distress, awake, alert and oriented x 3  Vascular: Dorsalis pedis artery 1/4 bilateral, Posterior tibial artery 1/4 bilateral, skin temperature warm to warm proximal to distal bilateral lower extremities, no varicosities, pedal hair present bilateral.  Neurological: Gross sensation present via light touch bilateral.   Dermatological: Skin is warm, dry, and supple bilateral, Nails 1-10 are tender, long, thick, and discolored with mild subungal debris, no webspace macerations present bilateral, no open lesions present bilateral, No hyperkeratotic tissue present bilateral plantar forefoot. No signs of infection bilateral.  There  was excessive cream noted to the tops of both feet dry and caked on.  Musculoskeletal: Asymptomatic fat pad atrophy and hammertoe boney deformities noted bilateral. Muscular strength within normal limits without painon range of motion. No pain with calf compression bilateral.  Assessment and Plan:  Problem List Items Addressed This Visit   None   Visit Diagnoses    Pain due to onychomycosis of toenails of both feet    -  Primary   PVD (peripheral vascular disease) ([place])         -Examined patient.  -Discussed treatment options for painful  mycotic nails. -Mechanically debrided and reduced mycotic nails with sterile nail nipper and dremel without incident -Recommend vicks to nails and advised patient to avoid excessively picking up cream on the tops of both her feet -Patient to return in 3 months for nail trim or sooner if symptoms worsen.  Landis Martins, DPM

## 2020-10-07 DIAGNOSIS — M170 Bilateral primary osteoarthritis of knee: Secondary | ICD-10-CM | POA: Diagnosis not present

## 2020-10-14 DIAGNOSIS — M170 Bilateral primary osteoarthritis of knee: Secondary | ICD-10-CM | POA: Diagnosis not present

## 2020-10-14 DIAGNOSIS — G301 Alzheimer's disease with late onset: Secondary | ICD-10-CM | POA: Diagnosis not present

## 2020-10-14 DIAGNOSIS — K219 Gastro-esophageal reflux disease without esophagitis: Secondary | ICD-10-CM | POA: Diagnosis not present

## 2020-10-14 DIAGNOSIS — E039 Hypothyroidism, unspecified: Secondary | ICD-10-CM | POA: Diagnosis not present

## 2020-10-14 DIAGNOSIS — E7800 Pure hypercholesterolemia, unspecified: Secondary | ICD-10-CM | POA: Diagnosis not present

## 2020-10-14 DIAGNOSIS — I10 Essential (primary) hypertension: Secondary | ICD-10-CM | POA: Diagnosis not present

## 2020-11-02 DIAGNOSIS — E7800 Pure hypercholesterolemia, unspecified: Secondary | ICD-10-CM | POA: Diagnosis not present

## 2020-11-02 DIAGNOSIS — R7303 Prediabetes: Secondary | ICD-10-CM | POA: Diagnosis not present

## 2020-11-02 DIAGNOSIS — E039 Hypothyroidism, unspecified: Secondary | ICD-10-CM | POA: Diagnosis not present

## 2020-11-02 DIAGNOSIS — G4733 Obstructive sleep apnea (adult) (pediatric): Secondary | ICD-10-CM | POA: Diagnosis not present

## 2020-11-02 DIAGNOSIS — G301 Alzheimer's disease with late onset: Secondary | ICD-10-CM | POA: Diagnosis not present

## 2020-11-02 DIAGNOSIS — Z0000 Encounter for general adult medical examination without abnormal findings: Secondary | ICD-10-CM | POA: Diagnosis not present

## 2020-11-02 DIAGNOSIS — I10 Essential (primary) hypertension: Secondary | ICD-10-CM | POA: Diagnosis not present

## 2020-11-12 DIAGNOSIS — E039 Hypothyroidism, unspecified: Secondary | ICD-10-CM | POA: Diagnosis not present

## 2020-11-12 DIAGNOSIS — R7303 Prediabetes: Secondary | ICD-10-CM | POA: Diagnosis not present

## 2020-11-12 DIAGNOSIS — I10 Essential (primary) hypertension: Secondary | ICD-10-CM | POA: Diagnosis not present

## 2020-11-17 DIAGNOSIS — I10 Essential (primary) hypertension: Secondary | ICD-10-CM | POA: Diagnosis not present

## 2020-11-17 DIAGNOSIS — E039 Hypothyroidism, unspecified: Secondary | ICD-10-CM | POA: Diagnosis not present

## 2020-11-17 DIAGNOSIS — M170 Bilateral primary osteoarthritis of knee: Secondary | ICD-10-CM | POA: Diagnosis not present

## 2020-11-17 DIAGNOSIS — E7800 Pure hypercholesterolemia, unspecified: Secondary | ICD-10-CM | POA: Diagnosis not present

## 2020-11-17 DIAGNOSIS — G301 Alzheimer's disease with late onset: Secondary | ICD-10-CM | POA: Diagnosis not present

## 2020-11-17 DIAGNOSIS — K219 Gastro-esophageal reflux disease without esophagitis: Secondary | ICD-10-CM | POA: Diagnosis not present

## 2020-12-02 ENCOUNTER — Telehealth: Payer: Self-pay | Admitting: Neurology

## 2020-12-02 NOTE — Telephone Encounter (Signed)
New message   Previous appt on  11.29.2021  Daughter Anderson Malta calling regarding a bill - insurance denial .

## 2020-12-03 ENCOUNTER — Telehealth: Payer: Self-pay | Admitting: Neurology

## 2020-12-03 NOTE — Telephone Encounter (Signed)
Pt daughter would like for Dr Delice Lesch to prescribe medication if she agrees with dr Nicole Kindred and to talk to her mom about the diet at the appointment on 12/06/20

## 2020-12-03 NOTE — Telephone Encounter (Signed)
Patient's daughter called to review a couple of things before the patient's appointment on Monday, 12/06/20 with Dr. Delice Lesch. She lives in [place] and cannot attend the visit.  1. Patient's daughter Anderson Malta called regarding the recommendation from Dr. Nicole Kindred for a new medication: cholinesterae inhibiter   [facility] [phone]  2. Daughter needs Dr. Delice Lesch to reinforce the "mind diet" with the patient.

## 2020-12-06 ENCOUNTER — Other Ambulatory Visit: Payer: Self-pay

## 2020-12-06 ENCOUNTER — Ambulatory Visit: Payer: Medicare Other | Admitting: Neurology

## 2020-12-06 ENCOUNTER — Encounter: Payer: Self-pay | Admitting: Neurology

## 2020-12-06 VITALS — BP 149/77 | HR 75 | Ht 66.0 in | Wt 200.8 lb

## 2020-12-06 DIAGNOSIS — G301 Alzheimer's disease with late onset: Secondary | ICD-10-CM

## 2020-12-06 DIAGNOSIS — F0280 Dementia in other diseases classified elsewhere without behavioral disturbance: Secondary | ICD-10-CM | POA: Diagnosis not present

## 2020-12-06 MED ORDER — DONEPEZIL HCL 10 MG PO TABS: ORAL_TABLET | ORAL | 3 refills | Status: AC

## 2020-12-06 NOTE — Telephone Encounter (Signed)
noted 

## 2020-12-06 NOTE — Patient Instructions (Signed)
You have a brain condition called dementia. This means your memory will be worsening over time and you will be needing more help.  1. Start Aricept (Donepezil) 10mg : take 1/2 tablet daily for 2 weeks, then increase to 1 tablet daily. This medicine can help slow down worsening of memory  2. Continue to monitor driving  3. Follow-up in 6 months, call for any changes   FALL PRECAUTIONS: Be cautious when walking. Scan the area for obstacles that may increase the risk of trips and falls. When getting up in the mornings, sit up at the edge of the bed for a few minutes before getting out of bed. Consider elevating the bed at the head end to avoid drop of blood pressure when getting up. Walk always in a well-lit room (use night lights in the walls). Avoid area rugs or power cords from appliances in the middle of the walkways. Use a walker or a cane if necessary and consider physical therapy for balance exercise. Get your eyesight checked regularly.  FINANCIAL OVERSIGHT: Supervision, especially oversight when making financial decisions or transactions is also recommended.  HOME SAFETY: Consider the safety of the kitchen when operating appliances like stoves, microwave oven, and blender. Consider having supervision and share cooking responsibilities until no longer able to participate in those. Accidents with firearms and other hazards in the house should be identified and addressed as well.  DRIVING: Regarding driving, in patients with progressive memory problems, driving will be impaired. We advise to have someone else do the driving if trouble finding directions or if minor accidents are reported. Independent driving assessment is available to determine safety of driving.  ABILITY TO BE LEFT ALONE: If patient is unable to contact 911 operator, consider using LifeLine, or when the need is there, arrange for someone to stay with patients. Smoking is a fire hazard, consider supervision or cessation. Risk of  wandering should be assessed by caregiver and if detected at any point, supervision and safe proof recommendations should be instituted.  MEDICATION SUPERVISION: Inability to self-administer medication needs to be constantly addressed. Implement a mechanism to ensure safe administration of the medications.  RECOMMENDATIONS FOR ALL PATIENTS WITH MEMORY PROBLEMS: 1. Continue to exercise (Recommend 30 minutes of walking everyday, or 3 hours every week) 2. Increase social interactions - continue going to [place] and enjoy social gatherings with friends and family 3. Eat healthy, avoid fried foods and eat more fruits and vegetables 4. Maintain adequate blood pressure, blood sugar, and blood cholesterol level. Reducing the risk of stroke and cardiovascular disease also helps promoting better memory. 5. Avoid stressful situations. Live a simple life and avoid aggravations. Organize your time and prepare for the next day in anticipation. 6. Sleep well, avoid any interruptions of sleep and avoid any distractions in the bedroom that may interfere with adequate sleep quality 7. Avoid sugar, avoid sweets as there is a strong link between excessive sugar intake, diabetes, and cognitive impairment We discussed the Mediterranean diet, which has been shown to help patients reduce the risk of progressive memory disorders and reduces cardiovascular risk. This includes eating fish, eat fruits and green leafy vegetables, nuts like almonds and hazelnuts, walnuts, and also use olive oil. Avoid fast foods and fried foods as much as possible. Avoid sweets and sugar as sugar use has been linked to worsening of memory function.  There is always a concern of gradual progression of memory problems. If this is the case, then we may need to adjust level of care  according to patient needs. Support, both to the patient and caregiver, should then be put into place.

## 2020-12-06 NOTE — Progress Notes (Signed)
NEUROLOGY FOLLOW UP OFFICE NOTE  Chelsea Taylor [id number] [birth year]  HISTORY OF PRESENT ILLNESS: I had the pleasure of seeing Chelsea Taylor in follow-up in the neurology clinic on 12/06/2020.  The patient was last seen 6 months ago for memory loss. She is accompanied by her friend from congregation, Chelsea Taylor, who helps supplement the history today. Records and images were personally reviewed where available.  I personally reviewed MRI brain without contrast done 06/2020 which did not show any acute changes. There was mild diffuse atrophy and chronic microvascular disease, tiny chronic cortically based infarct within the right frontal operculum. Neuropsychological evaluation in 06/2020 indicated a diagnosis of Alzheimer's disease. Findings were discussed with the patient and her friend today. She continues to live alone. Her daughter Chelsea Taylor lives in [place], Chelsea Taylor reports she is setting up an aide to come help Chelsea Taylor. Chelsea Taylor is not sure if she is taking her medications daily. She got a little confused driving to her hairdresser a couple of months ago, unable to recall how to get there. Her friends from congregation drive her to appointments, she drives minimally 1-2 miles to the supermarket with no issues. Chelsea Taylor manages her finances. She repeats herself several times during the appointment, asking repeatedly about which medication we had talked about starting. She does not cook, Chelsea Taylor does not know what she eats. Her daughter had contacted our office prior to the visit to impress upon the patient the importance of the MIND diet discussed by Dr. Nicole Taylor. She denies nay headaches, dizziness, focal numbness/tingling/weakness, no falls. She denies any diarrhea or other bowel/bladder issues. She has a CPAP machine but does not use it all the time.    History on Initial Assessment 06/07/2020: This is an 80 year old right-handed woman with a history of hypertension, hypothyroidism, RLS, colitis,  presenting for evaluation of memory changes. Her daughter expressed concern last August 2021 that she was not remembering her phone number, address, or where her daughter lived. MMSE 28/30 in 03/2020. She is alone in the office today. She reports her husband passed away last 11/18/22, her daughter lives in [place] and went home yesterday. She reports her memory is okay at times, other times she cannot remember things. She notes word-finding difficulties, then the word comes back. She started noticing changes a year ago. She denies missing medications, however then reports that her daughter fixed her pills in a pillbox before she left because when her daughter asked her if she took it, she would not know. She will have a nurse coming this week to set up her pillbox. She denies getting lost driving. Her husband had been managing finances, she paid some bills since he passed away. She denies misplacing things, then reports irritation with herself when she is looking for things. She denies leaving the stove on, she does not like cooking. She feels her mood is okay overall. She denies any hallucinations.   She denies any headaches, dizziness, diplopia, dysarthria, dysphagia, neck/back pain, focal numbness/tingling/weakness, bowel/bladder dysfunction. No anosmia, tremors, no falls. She has arthritis in her fingers. She does not sleep as much as she used to, feeling drowsy during the day but not taking naps. No family history of dementia, no history of significant head injuries or alcohol use.   Laboratory Data: TSH normal at PCP office  PAST MEDICAL HISTORY: Past Medical History:  Diagnosis Date  . Collagenous colitis   . Degenerative arthritis    Knees  . Dyslipidemia   . Fibromyalgia   .  Hypertension   . Hypothyroid   . Restless legs syndrome (RLS) 02/28/2013  . Sleep apnea     MEDICATIONS: Current Outpatient Medications on File Prior to Visit  Medication Sig Dispense Refill  . alendronate  (FOSAMAX) 70 MG tablet     . aspirin 81 MG tablet Take 81 mg by mouth at bedtime.     . bisoprolol (ZEBETA) 5 MG tablet Take 1 tablet (5 mg total) by mouth daily. 30 tablet 6  . calcium gluconate 500 MG tablet Take 500 mg by mouth 2 (two) times daily.     . celecoxib (CELEBREX) 200 MG capsule Take by mouth 2 (two) times daily.    . Cholecalciferol (VITAMIN D) 2000 units CAPS Take 2,000 Units by mouth daily.     . folic acid (FOLVITE) 818 MCG tablet Take 800 mcg by mouth at bedtime.     . furosemide (LASIX) 20 MG tablet Take 60 mg by mouth daily.    Marland Kitchen levothyroxine (SYNTHROID, LEVOTHROID) 125 MCG tablet Take 125 mcg by mouth daily before breakfast.    . losartan (COZAAR) 100 MG tablet     . losartan (COZAAR) 50 MG tablet Take 50 mg by mouth daily.    . Multiple Vitamin (MULTIVITAMIN) tablet Take 1 tablet by mouth daily.    . naproxen sodium (ALEVE) 220 MG tablet Take 220 mg by mouth as needed.     . Red Yeast Rice 600 MG CAPS Take 600 mg by mouth 2 (two) times daily.    Marland Kitchen rOPINIRole (REQUIP) 1 MG tablet Take 2 mg by mouth. 2 mg  tablets in afternoon and 3 mg tablets at bedtime    . sulfaSALAzine (AZULFIDINE) 500 MG EC tablet Take 1,500 mg by mouth 2 (two) times daily.     No current facility-administered medications on file prior to visit.    ALLERGIES: Allergies  Allergen Reactions  . Adhesive [Tape]   . Codeine   . Neosporin [Neomycin-Bacitracin Zn-Polymyx]   . Penicillins Rash    Has patient had a PCN reaction causing immediate rash, facial/tongue/throat swelling, SOB or lightheadedness with hypotension: No Has patient had a PCN reaction causing severe rash involving mucus membranes or skin necrosis: No Has patient had a PCN reaction that required hospitalization: No Has patient had a PCN reaction occurring within the last 10 years: No If all of the above answers are "NO", then may proceed with Cephalosporin use.    FAMILY HISTORY: Family History  Problem Relation Age of Onset   . Heart failure Mother   . Heart disease Mother   . Hyperlipidemia Mother   . Heart attack Mother   . Colon cancer Father   . Cancer Father   . Hypertension Father   . Heart attack Brother   . Heart disease Brother   . Diabetes Daughter     SOCIAL HISTORY: Social History   Socioeconomic History  . Marital status: Widowed    Spouse name: Not on file  . Number of children: 1  . Years of education: 5  . Highest education level: Not on file  Occupational History  . Occupation: Retired  Tobacco Use  . Smoking status: Never Smoker  . Smokeless tobacco: Never Used  Vaping Use  . Vaping Use: Never used  Substance and Sexual Activity  . Alcohol use: Not Currently    Comment: Consumes alcohol twice per year  . Drug use: No  . Sexual activity: Not on file  Other Topics Concern  . Not  on file  Social History Narrative   Right handed    Lives alone   Husband just passed away      NO BLOOD    Social Determinants of Health   Financial Resource Strain: Not on file  Food Insecurity: Not on file  Transportation Needs: Not on file  Physical Activity: Not on file  Stress: Not on file  Social Connections: Not on file  Intimate Partner Violence: Not on file     PHYSICAL EXAM: Vitals:   12/06/20 0959  BP: (!) 149/77  Pulse: 75  SpO2: 93%   General: No acute distress Head:  Normocephalic/atraumatic Skin/Extremities: No rash, no edema Neurological Exam: alert and awake. No aphasia or dysarthria. Fund of knowledge is appropriate.  Recent and remote memory are impaired, repeats the same question several times during the visit. Attention and concentration are reduced.  Cranial nerves: Pupils equal, round. Extraocular movements intact with no nystagmus. Visual fields full.  No facial asymmetry.  Motor: Bulk and tone normal, muscle strength 5/5 throughout with no pronator drift.   Finger to nose testing intact.  Gait slow and cautious.    IMPRESSION: This is an 80 yo RH woman  with a history of hypertension, hypothyroidism, RLS, colitis, with Neuropsychological evaluation in 06/2020 indicating mild Alzheimer's disease. MRI brain no acute changes, there is mild diffuse atrophy and chronic microvascular disease. Diagnosis, management, and prognosis discussed with patient and friend Chelsea Taylor. We discussed starting Donepezil, including side effects and expectations from medication. We discussed prognosis of AD, needing more help with medications and monitoring driving. Discussed the importance of future planning when she will require increased level of care. Discussed the importance of control of vascular risk factors, MIND diet, physical and brain stimulation exercises for brain health. Follow-up with our Memory Disorders PA Chelsea Taylor in 6 months, they know to call for any changes.   Thank you for allowing me to participate in her care.  Please do not hesitate to call for any questions or concerns.   Ellouise Newer, M.D.   CC: Dr. Drema Dallas

## 2020-12-16 DIAGNOSIS — E039 Hypothyroidism, unspecified: Secondary | ICD-10-CM | POA: Diagnosis not present

## 2020-12-16 DIAGNOSIS — I10 Essential (primary) hypertension: Secondary | ICD-10-CM | POA: Diagnosis not present

## 2020-12-16 DIAGNOSIS — G301 Alzheimer's disease with late onset: Secondary | ICD-10-CM | POA: Diagnosis not present

## 2020-12-16 DIAGNOSIS — E7800 Pure hypercholesterolemia, unspecified: Secondary | ICD-10-CM | POA: Diagnosis not present

## 2020-12-16 DIAGNOSIS — K219 Gastro-esophageal reflux disease without esophagitis: Secondary | ICD-10-CM | POA: Diagnosis not present

## 2020-12-16 DIAGNOSIS — M170 Bilateral primary osteoarthritis of knee: Secondary | ICD-10-CM | POA: Diagnosis not present

## 2020-12-30 ENCOUNTER — Other Ambulatory Visit: Payer: Self-pay

## 2020-12-30 ENCOUNTER — Encounter: Payer: Self-pay | Admitting: Sports Medicine

## 2020-12-30 ENCOUNTER — Ambulatory Visit: Payer: Medicare Other | Admitting: Sports Medicine

## 2020-12-30 DIAGNOSIS — K219 Gastro-esophageal reflux disease without esophagitis: Secondary | ICD-10-CM | POA: Insufficient documentation

## 2020-12-30 DIAGNOSIS — M79674 Pain in right toe(s): Secondary | ICD-10-CM

## 2020-12-30 DIAGNOSIS — B351 Tinea unguium: Secondary | ICD-10-CM

## 2020-12-30 DIAGNOSIS — Z531 Procedure and treatment not carried out because of patient's decision for reasons of belief and group pressure: Secondary | ICD-10-CM | POA: Insufficient documentation

## 2020-12-30 DIAGNOSIS — F0280 Dementia in other diseases classified elsewhere without behavioral disturbance: Secondary | ICD-10-CM | POA: Insufficient documentation

## 2020-12-30 DIAGNOSIS — L84 Corns and callosities: Secondary | ICD-10-CM

## 2020-12-30 DIAGNOSIS — E7800 Pure hypercholesterolemia, unspecified: Secondary | ICD-10-CM | POA: Insufficient documentation

## 2020-12-30 DIAGNOSIS — M797 Fibromyalgia: Secondary | ICD-10-CM | POA: Insufficient documentation

## 2020-12-30 DIAGNOSIS — R1310 Dysphagia, unspecified: Secondary | ICD-10-CM | POA: Insufficient documentation

## 2020-12-30 DIAGNOSIS — M79671 Pain in right foot: Secondary | ICD-10-CM

## 2020-12-30 DIAGNOSIS — R269 Unspecified abnormalities of gait and mobility: Secondary | ICD-10-CM | POA: Insufficient documentation

## 2020-12-30 DIAGNOSIS — M79675 Pain in left toe(s): Secondary | ICD-10-CM

## 2020-12-30 DIAGNOSIS — Z800 Family history of malignant neoplasm of digestive organs: Secondary | ICD-10-CM | POA: Insufficient documentation

## 2020-12-30 DIAGNOSIS — R413 Other amnesia: Secondary | ICD-10-CM | POA: Insufficient documentation

## 2020-12-30 DIAGNOSIS — M170 Bilateral primary osteoarthritis of knee: Secondary | ICD-10-CM | POA: Insufficient documentation

## 2020-12-30 DIAGNOSIS — G4733 Obstructive sleep apnea (adult) (pediatric): Secondary | ICD-10-CM | POA: Insufficient documentation

## 2020-12-30 DIAGNOSIS — F4320 Adjustment disorder, unspecified: Secondary | ICD-10-CM | POA: Insufficient documentation

## 2020-12-30 DIAGNOSIS — I739 Peripheral vascular disease, unspecified: Secondary | ICD-10-CM | POA: Insufficient documentation

## 2020-12-30 DIAGNOSIS — R4189 Other symptoms and signs involving cognitive functions and awareness: Secondary | ICD-10-CM | POA: Insufficient documentation

## 2020-12-30 DIAGNOSIS — E559 Vitamin D deficiency, unspecified: Secondary | ICD-10-CM | POA: Insufficient documentation

## 2020-12-30 DIAGNOSIS — M8588 Other specified disorders of bone density and structure, other site: Secondary | ICD-10-CM | POA: Insufficient documentation

## 2020-12-30 DIAGNOSIS — M79672 Pain in left foot: Secondary | ICD-10-CM

## 2020-12-30 DIAGNOSIS — R7303 Prediabetes: Secondary | ICD-10-CM | POA: Insufficient documentation

## 2020-12-30 NOTE — Progress Notes (Signed)
Subjective: Chelsea Taylor is a 80 y.o. female patient seen today in office with complaint of mildly painful thickened and elongated toenails; unable to trim. No other pedal complaints.  Patient Active Problem List   Diagnosis Date Noted  . Bilateral impacted cerumen 11/29/2017  . Presbycusis of both ears 11/29/2017  . Pleural effusion 06/20/2017  . Microscopic colitis 06/01/2017  . Acute respiratory failure ([place]) 05/31/2017  . Essential hypertension 05/31/2017  . Hypothyroidism 05/31/2017  . Varicose veins of lower extremities with complications 89/37/3428  . Edema 06/03/2014  . Restless legs syndrome (RLS) 02/28/2013    Current Outpatient Medications on File Prior to Visit  Medication Sig Dispense Refill  . alendronate (FOSAMAX) 70 MG tablet     . aspirin 81 MG tablet Take 81 mg by mouth at bedtime.     . bisoprolol (ZEBETA) 5 MG tablet Take 1 tablet (5 mg total) by mouth daily. 30 tablet 6  . calcium gluconate 500 MG tablet Take 500 mg by mouth 2 (two) times daily.     . celecoxib (CELEBREX) 200 MG capsule Take by mouth 2 (two) times daily.    . Cholecalciferol (VITAMIN D) 2000 units CAPS Take 2,000 Units by mouth daily.     Marland Kitchen donepezil (ARICEPT) 10 MG tablet Take 1/2 tablet daily for 2 weeks, then increase to 1 tablet daily and continue 90 tablet 3  . folic acid (FOLVITE) 768 MCG tablet Take 800 mcg by mouth at bedtime.     . furosemide (LASIX) 20 MG tablet Take 60 mg by mouth daily.    Marland Kitchen levothyroxine (SYNTHROID, LEVOTHROID) 125 MCG tablet Take 125 mcg by mouth daily before breakfast.    . losartan (COZAAR) 100 MG tablet     . losartan (COZAAR) 50 MG tablet Take 50 mg by mouth daily.    . Multiple Vitamin (MULTIVITAMIN) tablet Take 1 tablet by mouth daily.    . naproxen sodium (ALEVE) 220 MG tablet Take 220 mg by mouth as needed.     . Red Yeast Rice 600 MG CAPS Take 600 mg by mouth 2 (two) times daily.    Marland Kitchen rOPINIRole (REQUIP) 1 MG tablet Take 2 mg by mouth. 2 mg  tablets in  afternoon and 3 mg tablets at bedtime    . sulfaSALAzine (AZULFIDINE) 500 MG EC tablet Take 1,500 mg by mouth 2 (two) times daily.     No current facility-administered medications on file prior to visit.    Allergies  Allergen Reactions  . Adhesive [Tape]   . Codeine   . Neosporin [Neomycin-Bacitracin Zn-Polymyx]   . Penicillins Rash    Has patient had a PCN reaction causing immediate rash, facial/tongue/throat swelling, SOB or lightheadedness with hypotension: No Has patient had a PCN reaction causing severe rash involving mucus membranes or skin necrosis: No Has patient had a PCN reaction that required hospitalization: No Has patient had a PCN reaction occurring within the last 10 years: No If all of the above answers are "NO", then may proceed with Cephalosporin use.    Objective: Physical Exam  General: Well developed, nourished, no acute distress, awake, alert and oriented x 3  Vascular: Dorsalis pedis artery 1/4 bilateral, Posterior tibial artery 1/4 bilateral, skin temperature warm to warm proximal to distal bilateral lower extremities, no varicosities, pedal hair present bilateral.  Neurological: Gross sensation present via light touch bilateral.   Dermatological: Skin is warm, dry, and supple bilateral, Nails 1-10 are tender, long, thick, and discolored with mild subungal  debris, no webspace macerations present bilateral, no open lesions present bilateral, No hyperkeratotic tissue present bilateral plantar forefoot. No signs of infection bilateral.  There was excessive cream noted to the tops of both feet dry and caked on.  Musculoskeletal: Asymptomatic fat pad atrophy and hammertoe boney deformities noted bilateral. Muscular strength within normal limits without painon range of motion. No pain with calf compression bilateral.  Assessment and Plan:  Problem List Items Addressed This Visit   None   Visit Diagnoses    Pain due to onychomycosis of toenails of both feet    -   Primary   PVD (peripheral vascular disease) ([place])       Bilateral foot pain       Callus         -Examined patient.  -Discussed treatment options for painful mycotic nails. -Mechanically debrided and reduced mycotic nails with sterile nail nipper and dremel without incident -Patient to return in 3 months for nail trim or sooner if symptoms worsen.  Landis Martins, DPM

## 2021-01-04 ENCOUNTER — Telehealth: Payer: Self-pay | Admitting: Physician Assistant

## 2021-01-04 DIAGNOSIS — H6123 Impacted cerumen, bilateral: Secondary | ICD-10-CM | POA: Diagnosis not present

## 2021-01-04 DIAGNOSIS — H6982 Other specified disorders of Eustachian tube, left ear: Secondary | ICD-10-CM | POA: Insufficient documentation

## 2021-01-04 DIAGNOSIS — H6992 Unspecified Eustachian tube disorder, left ear: Secondary | ICD-10-CM | POA: Insufficient documentation

## 2021-01-04 NOTE — Telephone Encounter (Signed)
Left message for patient daughter to contact her insurance or check with social services here in [place].

## 2021-01-04 NOTE — Telephone Encounter (Signed)
Patient's daughter Anderson Malta called requesting a call back. She said the patient's insurance denied a request for a home health aide. She'd like to know what other options may be available.

## 2021-01-06 ENCOUNTER — Telehealth: Payer: Self-pay | Admitting: Neurology

## 2021-01-06 NOTE — Telephone Encounter (Signed)
Chelsea Taylor called in and wants a rtn call from [place]. Said only call her cell phone if you cant get in touch with her on work phone. Cell phone does not work well.

## 2021-01-06 NOTE — Telephone Encounter (Signed)
Anderson Malta the daughter is going to send a letter from her denial services for Dr.Aquino to look at and do a letter to appeal for help for home health. Please be on look out for it, thanks

## 2021-01-06 NOTE — Telephone Encounter (Signed)
Left message

## 2021-01-17 ENCOUNTER — Telehealth: Payer: Self-pay | Admitting: Physician Assistant

## 2021-01-17 DIAGNOSIS — E039 Hypothyroidism, unspecified: Secondary | ICD-10-CM | POA: Diagnosis not present

## 2021-01-17 NOTE — Telephone Encounter (Signed)
Chelsea Taylor and wants a return call from [facility]. Said she needs paper work done that was faxed 01/10/21

## 2021-01-17 NOTE — Telephone Encounter (Signed)
Left message no form never receive it. Ask she refaxed.

## 2021-01-18 NOTE — Telephone Encounter (Signed)
Pt daughter called questions on appeal paper work answered,

## 2021-01-18 NOTE — Telephone Encounter (Signed)
Daughter jennifer called in for mother.  She wants to know what she needs to do for the long term care, regarding the appeal paperwork. [phone]

## 2021-01-19 DIAGNOSIS — M170 Bilateral primary osteoarthritis of knee: Secondary | ICD-10-CM | POA: Diagnosis not present

## 2021-01-19 DIAGNOSIS — K219 Gastro-esophageal reflux disease without esophagitis: Secondary | ICD-10-CM | POA: Diagnosis not present

## 2021-01-19 DIAGNOSIS — E039 Hypothyroidism, unspecified: Secondary | ICD-10-CM | POA: Diagnosis not present

## 2021-01-19 DIAGNOSIS — I10 Essential (primary) hypertension: Secondary | ICD-10-CM | POA: Diagnosis not present

## 2021-01-19 DIAGNOSIS — E7800 Pure hypercholesterolemia, unspecified: Secondary | ICD-10-CM | POA: Diagnosis not present

## 2021-01-19 DIAGNOSIS — G301 Alzheimer's disease with late onset: Secondary | ICD-10-CM | POA: Diagnosis not present

## 2021-01-19 NOTE — Telephone Encounter (Signed)
Form done, pls print out my last clinic note and Neuropsych results to help. Thanks

## 2021-01-19 NOTE — Telephone Encounter (Signed)
Chelsea Taylor would like a call back regarding her mother, Chelsea Taylor. She said Nira Conn called her but she has more info to give  her. Her mother fell 2x and didn't remember to call life alert. She is forgetting to set reminder for meds. This is regarding her long term care insurance. [phone]

## 2021-01-20 NOTE — Telephone Encounter (Signed)
Paperwork faxed, office notes and neuropsych results faxed with it

## 2021-01-25 ENCOUNTER — Telehealth: Payer: Self-pay | Admitting: Neurology

## 2021-01-25 NOTE — Telephone Encounter (Signed)
Could you call daughter regarding the paper work that was supposed to be put in my chart [phone] ext [phone]

## 2021-01-26 NOTE — Telephone Encounter (Signed)
Daughter called again regarding her mothers records. She cant see it in her mychart and would like some one to call and fax her the paper work she needs. [phone] ext [phone]. She has spoken with heather, but since she is out, she is asking for someone else to please call her

## 2021-01-27 NOTE — Telephone Encounter (Signed)
Copy of paper work faxed to pt daughter POA Anderson Malta

## 2021-01-31 DIAGNOSIS — M170 Bilateral primary osteoarthritis of knee: Secondary | ICD-10-CM | POA: Diagnosis not present

## 2021-02-14 DIAGNOSIS — Z0279 Encounter for issue of other medical certificate: Secondary | ICD-10-CM

## 2021-02-15 DIAGNOSIS — G301 Alzheimer's disease with late onset: Secondary | ICD-10-CM | POA: Diagnosis not present

## 2021-02-15 DIAGNOSIS — M7989 Other specified soft tissue disorders: Secondary | ICD-10-CM | POA: Diagnosis not present

## 2021-02-17 DIAGNOSIS — E7800 Pure hypercholesterolemia, unspecified: Secondary | ICD-10-CM | POA: Diagnosis not present

## 2021-02-17 DIAGNOSIS — K219 Gastro-esophageal reflux disease without esophagitis: Secondary | ICD-10-CM | POA: Diagnosis not present

## 2021-02-17 DIAGNOSIS — I10 Essential (primary) hypertension: Secondary | ICD-10-CM | POA: Diagnosis not present

## 2021-02-17 DIAGNOSIS — M170 Bilateral primary osteoarthritis of knee: Secondary | ICD-10-CM | POA: Diagnosis not present

## 2021-02-17 DIAGNOSIS — E039 Hypothyroidism, unspecified: Secondary | ICD-10-CM | POA: Diagnosis not present

## 2021-02-17 DIAGNOSIS — G301 Alzheimer's disease with late onset: Secondary | ICD-10-CM | POA: Diagnosis not present

## 2021-02-22 DIAGNOSIS — M65332 Trigger finger, left middle finger: Secondary | ICD-10-CM | POA: Diagnosis not present

## 2021-02-22 DIAGNOSIS — M65331 Trigger finger, right middle finger: Secondary | ICD-10-CM | POA: Diagnosis not present

## 2021-02-28 ENCOUNTER — Telehealth: Payer: Self-pay | Admitting: Neurology

## 2021-02-28 NOTE — Telephone Encounter (Signed)
Pt daughter, Anderson Malta would like to know if the FMLA was faxed in. Could you call and let her know [phone] ext [phone] Cell phone [phone]

## 2021-03-01 NOTE — Telephone Encounter (Signed)
Done, thanks

## 2021-03-02 ENCOUNTER — Telehealth: Payer: Self-pay | Admitting: Neurology

## 2021-03-02 NOTE — Telephone Encounter (Signed)
Pt daughter called FMLA paper work mailed and faxed

## 2021-03-02 NOTE — Telephone Encounter (Signed)
Pt daughter called in again regarding FMLA paper work. She would like a call back from [facility] to know if she has sent them. Is she going to fax them or mail them? I did let her know Delice Lesch said they were done [phone] ext [phone]

## 2021-03-02 NOTE — Telephone Encounter (Signed)
Paperwork is faxed and mailed

## 2021-03-08 DIAGNOSIS — H43813 Vitreous degeneration, bilateral: Secondary | ICD-10-CM | POA: Diagnosis not present

## 2021-03-08 DIAGNOSIS — Z961 Presence of intraocular lens: Secondary | ICD-10-CM | POA: Diagnosis not present

## 2021-03-08 DIAGNOSIS — H5201 Hypermetropia, right eye: Secondary | ICD-10-CM | POA: Diagnosis not present

## 2021-03-08 DIAGNOSIS — H524 Presbyopia: Secondary | ICD-10-CM | POA: Diagnosis not present

## 2021-03-16 ENCOUNTER — Other Ambulatory Visit: Payer: Self-pay | Admitting: Gastroenterology

## 2021-03-16 DIAGNOSIS — R1033 Periumbilical pain: Secondary | ICD-10-CM | POA: Diagnosis not present

## 2021-03-16 DIAGNOSIS — K52839 Microscopic colitis, unspecified: Secondary | ICD-10-CM | POA: Diagnosis not present

## 2021-03-21 ENCOUNTER — Ambulatory Visit
Admission: RE | Admit: 2021-03-21 | Discharge: 2021-03-21 | Disposition: A | Discharge status: Home / Self Care | Payer: Medicare Other | Source: Ambulatory Visit | Attending: Gastroenterology | Admitting: Gastroenterology

## 2021-03-21 ENCOUNTER — Other Ambulatory Visit: Payer: Self-pay

## 2021-03-21 DIAGNOSIS — K449 Diaphragmatic hernia without obstruction or gangrene: Secondary | ICD-10-CM | POA: Diagnosis not present

## 2021-03-21 DIAGNOSIS — R1033 Periumbilical pain: Secondary | ICD-10-CM

## 2021-03-21 DIAGNOSIS — R109 Unspecified abdominal pain: Secondary | ICD-10-CM | POA: Diagnosis not present

## 2021-03-24 DIAGNOSIS — E7800 Pure hypercholesterolemia, unspecified: Secondary | ICD-10-CM | POA: Diagnosis not present

## 2021-03-24 DIAGNOSIS — I10 Essential (primary) hypertension: Secondary | ICD-10-CM | POA: Diagnosis not present

## 2021-03-24 DIAGNOSIS — M170 Bilateral primary osteoarthritis of knee: Secondary | ICD-10-CM | POA: Diagnosis not present

## 2021-03-24 DIAGNOSIS — E039 Hypothyroidism, unspecified: Secondary | ICD-10-CM | POA: Diagnosis not present

## 2021-03-24 DIAGNOSIS — K219 Gastro-esophageal reflux disease without esophagitis: Secondary | ICD-10-CM | POA: Diagnosis not present

## 2021-03-24 DIAGNOSIS — G301 Alzheimer's disease with late onset: Secondary | ICD-10-CM | POA: Diagnosis not present

## 2021-04-05 ENCOUNTER — Telehealth: Payer: Self-pay | Admitting: Sports Medicine

## 2021-04-05 NOTE — Telephone Encounter (Signed)
Patient daughter called and stated that her mom home health aid is unavail to bring her because they are on vacation. She wanted to know if you can squeeze her in next week since she is moving out of stated on 09/15.  Contact [phone] dial [phone]  Patient is requesting an afternoon

## 2021-04-07 ENCOUNTER — Ambulatory Visit: Payer: Medicare Other | Admitting: Sports Medicine

## 2021-04-11 DIAGNOSIS — E039 Hypothyroidism, unspecified: Secondary | ICD-10-CM | POA: Diagnosis not present

## 2021-04-11 DIAGNOSIS — E7800 Pure hypercholesterolemia, unspecified: Secondary | ICD-10-CM | POA: Diagnosis not present

## 2021-04-11 DIAGNOSIS — M170 Bilateral primary osteoarthritis of knee: Secondary | ICD-10-CM | POA: Diagnosis not present

## 2021-04-11 DIAGNOSIS — K219 Gastro-esophageal reflux disease without esophagitis: Secondary | ICD-10-CM | POA: Diagnosis not present

## 2021-04-11 DIAGNOSIS — G301 Alzheimer's disease with late onset: Secondary | ICD-10-CM | POA: Diagnosis not present

## 2021-04-11 DIAGNOSIS — I10 Essential (primary) hypertension: Secondary | ICD-10-CM | POA: Diagnosis not present

## 2021-04-14 ENCOUNTER — Other Ambulatory Visit: Payer: Self-pay

## 2021-04-14 ENCOUNTER — Ambulatory Visit: Payer: Medicare Other | Admitting: Sports Medicine

## 2021-04-14 ENCOUNTER — Encounter: Payer: Self-pay | Admitting: Sports Medicine

## 2021-04-14 DIAGNOSIS — M79675 Pain in left toe(s): Secondary | ICD-10-CM

## 2021-04-14 DIAGNOSIS — M79674 Pain in right toe(s): Secondary | ICD-10-CM

## 2021-04-14 DIAGNOSIS — B351 Tinea unguium: Secondary | ICD-10-CM

## 2021-04-14 DIAGNOSIS — I739 Peripheral vascular disease, unspecified: Secondary | ICD-10-CM

## 2021-04-14 NOTE — Progress Notes (Signed)
Subjective: Chelsea Taylor is a 80 y.o. female patient seen today in office with complaint of mildly painful thickened and elongated toenails; unable to trim.  Reports that she will be moving out of state her daughter has set up for her to go to a assisted living facility.  No other pedal complaints.  Patient is assisted by aide at this visit.  Patient Active Problem List   Diagnosis Date Noted   Eustachian tube dysfunction, left 01/04/2021   Abnormal gait 12/30/2020   Adjustment disorder 12/30/2020   Altered thought processes 12/30/2020   Alzheimer's disease ([place]) 12/30/2020   Amnesia 12/30/2020   Bilateral primary osteoarthritis of knee 12/30/2020   Pure hypercholesterolemia 12/30/2020   Family history of malignant neoplasm of gastrointestinal tract 12/30/2020   Fibromyalgia 12/30/2020   Gastroesophageal reflux disease 12/30/2020   Obstructive sleep apnea syndrome 12/30/2020   Odynophagia 12/30/2020   Other specified disorders of bone density and structure, other site 12/30/2020   Peripheral vascular disease ([place]) 12/30/2020   Prediabetes 12/30/2020   Refusal of blood transfusions as patient is Jehovah's Witness 12/30/2020   Vitamin D deficiency 12/30/2020   Bilateral impacted cerumen 11/29/2017   Presbycusis of both ears 11/29/2017   Pleural effusion 06/20/2017   Microscopic colitis 06/01/2017   Acute respiratory failure ([place]) 05/31/2017   Essential hypertension 05/31/2017   Hypothyroidism 05/31/2017   Varicose veins of lower extremities with complications [id number]   Edema 06/03/2014   Restless legs syndrome (RLS) 02/28/2013    Current Outpatient Medications on File Prior to Visit  Medication Sig Dispense Refill   alendronate (FOSAMAX) 70 MG tablet      aspirin 81 MG tablet Take 81 mg by mouth at bedtime.      bisoprolol (ZEBETA) 5 MG tablet Take 1 tablet (5 mg total) by mouth daily. 30 tablet 6   calcium gluconate 500 MG tablet Take 500 mg by mouth 2 (two) times daily.       celecoxib (CELEBREX) 200 MG capsule Take by mouth 2 (two) times daily.     Cholecalciferol (VITAMIN D) 2000 units CAPS Take 2,000 Units by mouth daily.      donepezil (ARICEPT) 10 MG tablet Take 1/2 tablet daily for 2 weeks, then increase to 1 tablet daily and continue 90 tablet 3   folic acid (FOLVITE) Q000111Q MCG tablet Take 800 mcg by mouth at bedtime.      furosemide (LASIX) 20 MG tablet Take 60 mg by mouth daily.     levothyroxine (SYNTHROID, LEVOTHROID) 125 MCG tablet Take 125 mcg by mouth daily before breakfast.     LORazepam (ATIVAN) 2 MG tablet SMARTSIG:1 Tablet(s) By Mouth Every 12 Hours PRN     losartan (COZAAR) 100 MG tablet      losartan (COZAAR) 50 MG tablet Take 50 mg by mouth daily.     Multiple Vitamin (MULTIVITAMIN) tablet Take 1 tablet by mouth daily.     naproxen sodium (ALEVE) 220 MG tablet Take 220 mg by mouth as needed.      Oyster Shell Calcium 500 MG TABS      PFIZER-BIONT COVID-19 VAC-TRIS SUSP injection      Red Yeast Rice 600 MG CAPS Take 600 mg by mouth 2 (two) times daily.     rOPINIRole (REQUIP) 1 MG tablet Take 2 mg by mouth. 2 mg  tablets in afternoon and 3 mg tablets at bedtime     sulfaSALAzine (AZULFIDINE) 500 MG EC tablet Take 1,500 mg by mouth 2 (two) times  daily.     No current facility-administered medications on file prior to visit.    Allergies  Allergen Reactions   Adhesive [Tape]    Codeine    Neosporin [Neomycin-Bacitracin Zn-Polymyx]    Penicillins Rash    Has patient had a PCN reaction causing immediate rash, facial/tongue/throat swelling, SOB or lightheadedness with hypotension: No Has patient had a PCN reaction causing severe rash involving mucus membranes or skin necrosis: No Has patient had a PCN reaction that required hospitalization: No Has patient had a PCN reaction occurring within the last 10 years: No If all of the above answers are "NO", then may proceed with Cephalosporin use.    Objective: Physical Exam  General: Well  developed, nourished, no acute distress, awake, alert and oriented x 3  Vascular: Dorsalis pedis artery 1/4 bilateral, Posterior tibial artery 1/4 bilateral, skin temperature warm to warm proximal to distal bilateral lower extremities, no varicosities, pedal hair present bilateral.  Neurological: Gross sensation present via light touch bilateral.  Patient is hypersensitive during exam likely related to her restless legs.  Dermatological: Skin is warm, dry, and supple bilateral, Nails 1-10 are tender, long, thick, and discolored with mild subungal debris, no webspace macerations present bilateral, no open lesions present bilateral, No hyperkeratotic tissue present bilateral plantar forefoot. No signs of infection bilateral.  There was excessive cream noted to the tops of both feet dry and caked on.  Musculoskeletal: Asymptomatic fat pad atrophy and hammertoe boney deformities noted bilateral. Muscular strength within normal limits without painon range of motion. No pain with calf compression bilateral.  Assessment and Plan:  Problem List Items Addressed This Visit   None Visit Diagnoses     Pain due to onychomycosis of toenails of both feet    -  Primary   Relevant Medications   PFIZER-BIONT COVID-19 VAC-TRIS SUSP injection   PVD (peripheral vascular disease) ([place])          -Examined patient.  -Discussed treatment options for painful mycotic nails. -Mechanically debrided and reduced mycotic nails with sterile nail nipper and dremel without incident -Patient to return as needed since she will be moving out of state or sooner if symptoms worsen.  Landis Martins, DPM

## 2021-04-20 DIAGNOSIS — Z1159 Encounter for screening for other viral diseases: Secondary | ICD-10-CM | POA: Diagnosis not present

## 2021-04-20 DIAGNOSIS — Z23 Encounter for immunization: Secondary | ICD-10-CM | POA: Diagnosis not present

## 2021-04-20 DIAGNOSIS — E7800 Pure hypercholesterolemia, unspecified: Secondary | ICD-10-CM | POA: Diagnosis not present

## 2021-04-20 DIAGNOSIS — Z03818 Encounter for observation for suspected exposure to other biological agents ruled out: Secondary | ICD-10-CM | POA: Diagnosis not present

## 2021-05-03 DIAGNOSIS — G4733 Obstructive sleep apnea (adult) (pediatric): Secondary | ICD-10-CM | POA: Diagnosis not present

## 2021-05-04 DIAGNOSIS — I70203 Unspecified atherosclerosis of native arteries of extremities, bilateral legs: Secondary | ICD-10-CM | POA: Diagnosis not present

## 2021-05-04 DIAGNOSIS — R7309 Other abnormal glucose: Secondary | ICD-10-CM | POA: Diagnosis not present

## 2021-05-04 DIAGNOSIS — M79662 Pain in left lower leg: Secondary | ICD-10-CM | POA: Diagnosis not present

## 2021-05-04 DIAGNOSIS — I872 Venous insufficiency (chronic) (peripheral): Secondary | ICD-10-CM | POA: Diagnosis not present

## 2021-05-04 DIAGNOSIS — D649 Anemia, unspecified: Secondary | ICD-10-CM | POA: Diagnosis not present

## 2021-05-04 DIAGNOSIS — L603 Nail dystrophy: Secondary | ICD-10-CM | POA: Diagnosis not present

## 2021-05-04 DIAGNOSIS — E039 Hypothyroidism, unspecified: Secondary | ICD-10-CM | POA: Diagnosis not present

## 2021-05-04 DIAGNOSIS — R609 Edema, unspecified: Secondary | ICD-10-CM | POA: Diagnosis not present

## 2021-05-04 DIAGNOSIS — I749 Embolism and thrombosis of unspecified artery: Secondary | ICD-10-CM | POA: Diagnosis not present

## 2021-05-04 DIAGNOSIS — I80293 Phlebitis and thrombophlebitis of other deep vessels of lower extremity, bilateral: Secondary | ICD-10-CM | POA: Diagnosis not present

## 2021-05-04 DIAGNOSIS — M79661 Pain in right lower leg: Secondary | ICD-10-CM | POA: Diagnosis not present

## 2021-05-04 DIAGNOSIS — B351 Tinea unguium: Secondary | ICD-10-CM | POA: Diagnosis not present

## 2021-06-08 ENCOUNTER — Ambulatory Visit: Payer: Medicare Other | Admitting: Physician Assistant

## 2023-05-19 IMAGING — CT CT ABD-PELV W/O CM
1 of 2 series · 14 of 32 positions shown, 19 images · non-contrast
Comparison: 04/28/2019

CLINICAL DATA: Periumbilical pain

EXAM:
CT ABDOMEN AND PELVIS WITHOUT CONTRAST
TECHNIQUE: Multidetector CT imaging of the abdomen and pelvis was performed
following the standard protocol without IV contrast.

[Series 2: abd/pelvis w/(date) · axial · 0.93mm/px · z∈[-472,-122]mm · 14 of 80 slices shown, 19 images]
[im 5/80  soft-tissue]
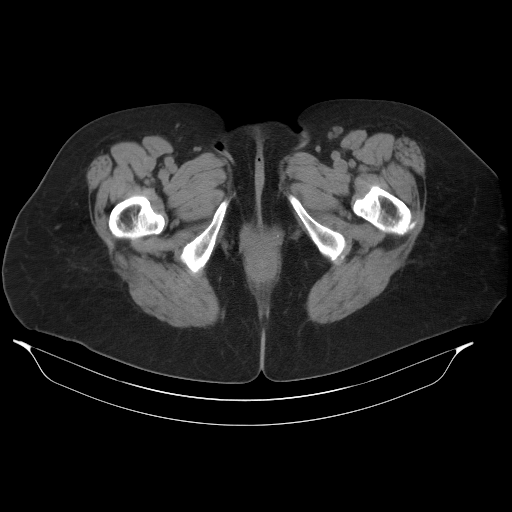
[im 5/80  bone]
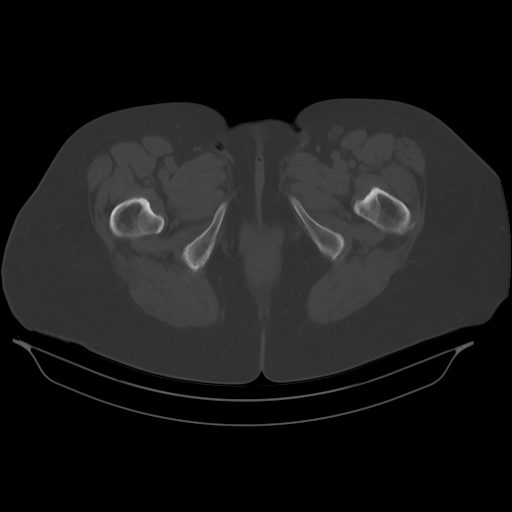
[im 13/80  soft-tissue]
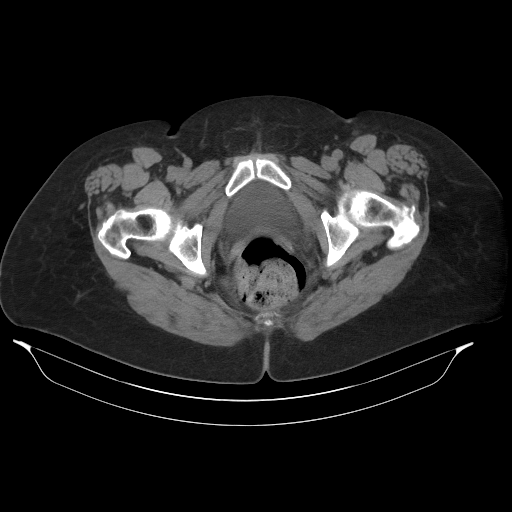
[im 17/80  soft-tissue]
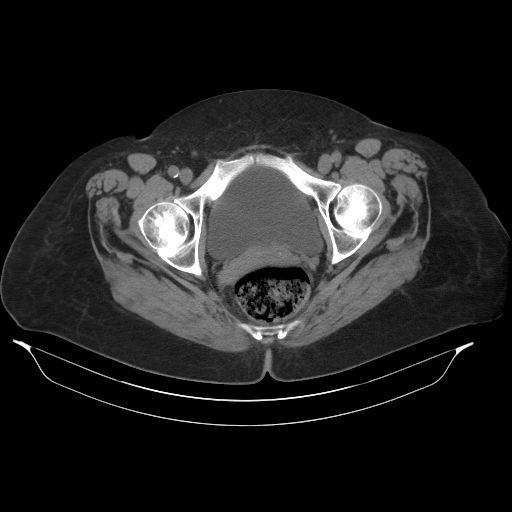
[im 21/80  soft-tissue]
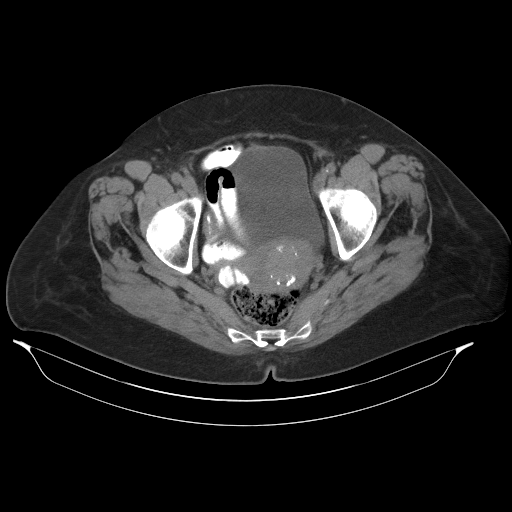
[im 30/80  soft-tissue]
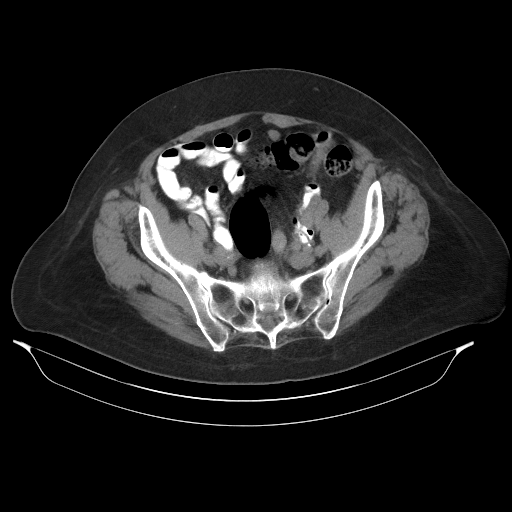
[im 34/80  soft-tissue]
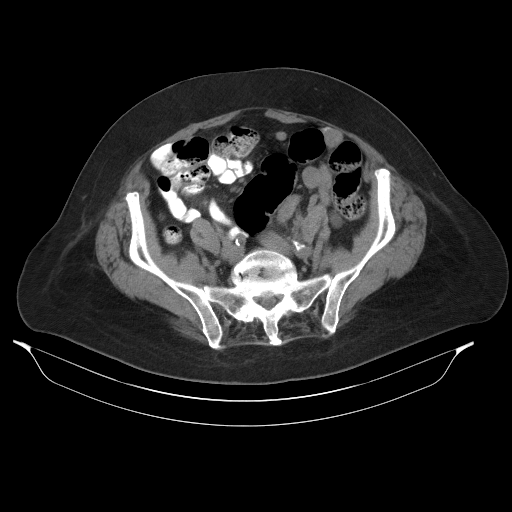
[im 42/80  soft-tissue]
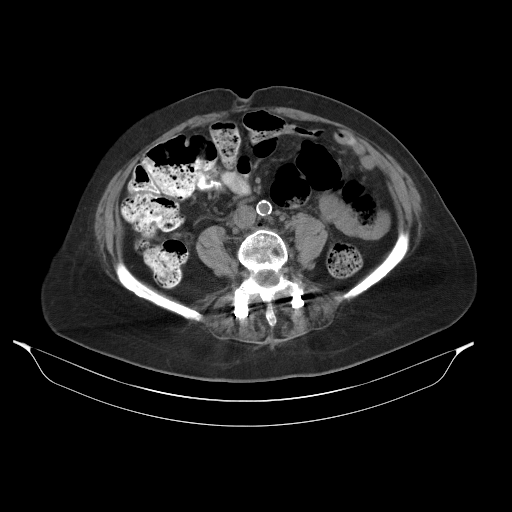
[im 46/80  soft-tissue]
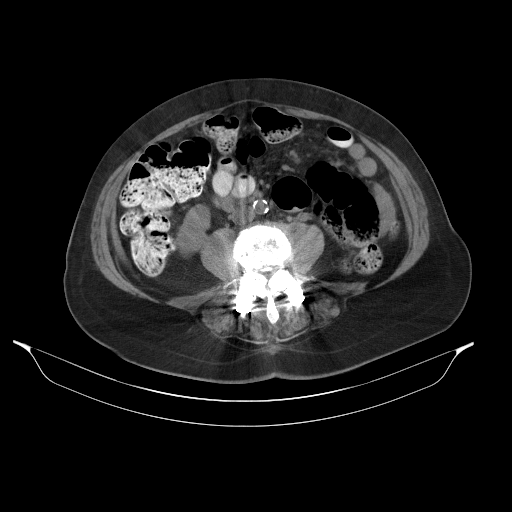
[im 50/80  soft-tissue]
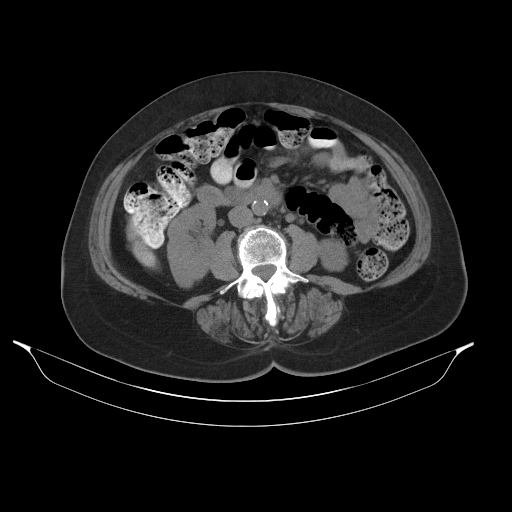
[im 50/80  bone]
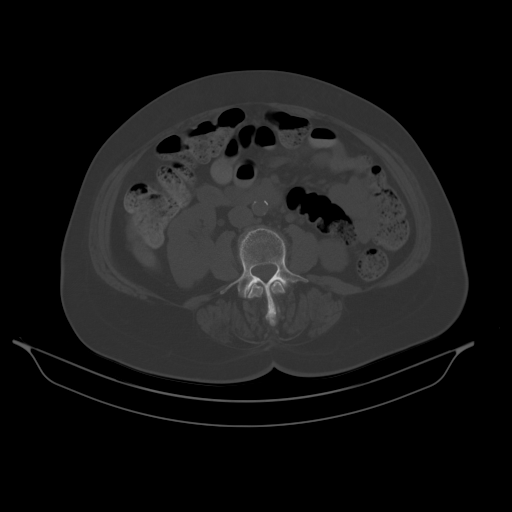
[im 59/80  soft-tissue]
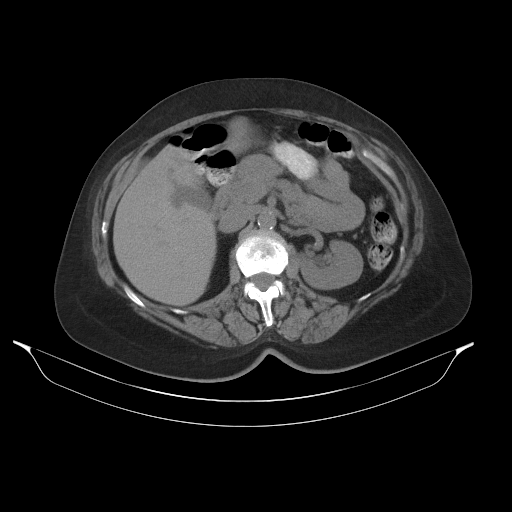
[im 63/80  soft-tissue]
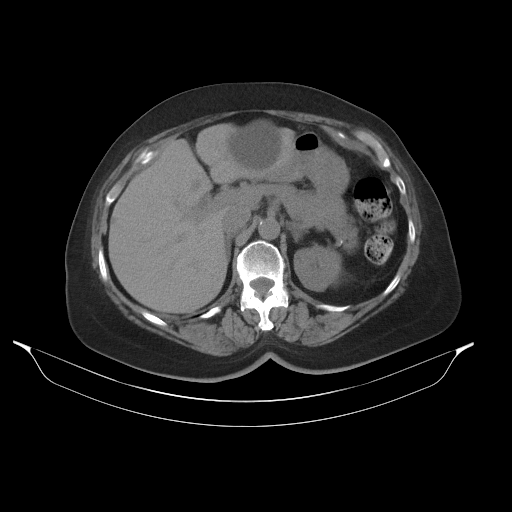
[im 63/80  lung]
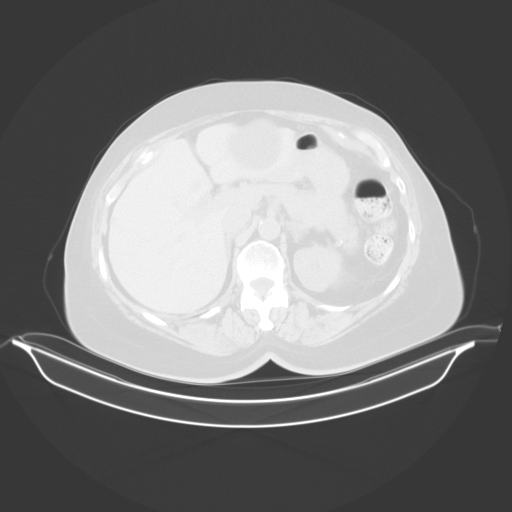
[im 67/80  soft-tissue]
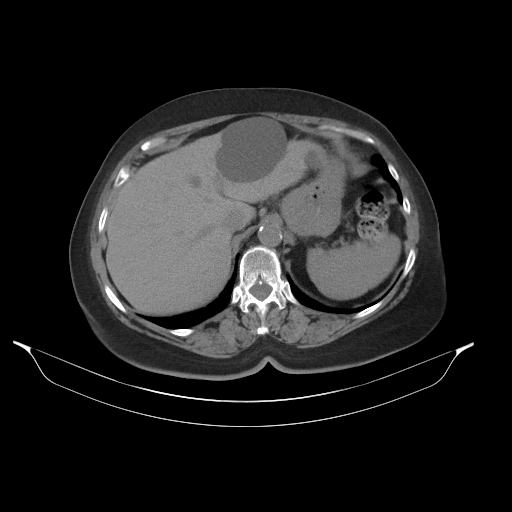
[im 67/80  lung]
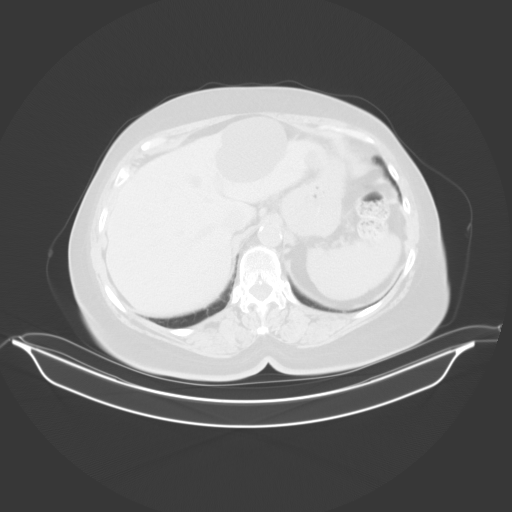
[im 71/80  lung]
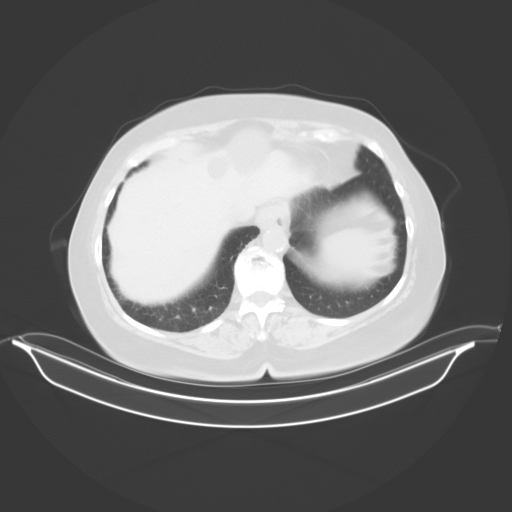
[im 75/80  soft-tissue]
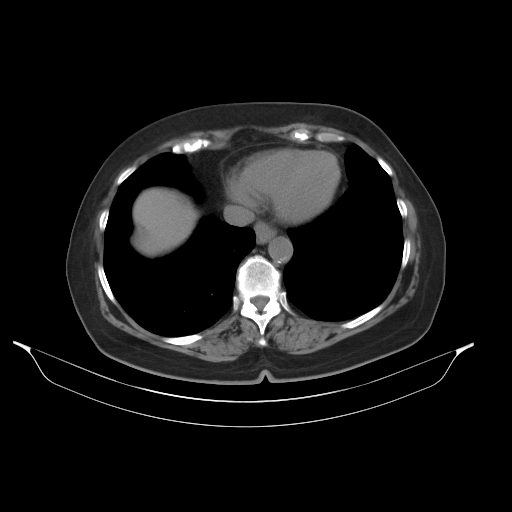
[im 75/80  lung]
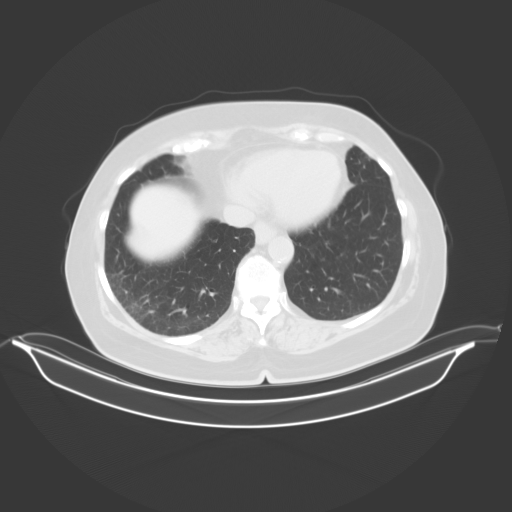

[14 of 32 positions shown; findings below may reference images not displayed]

FINDINGS: Lower chest: Lung bases are clear.

Hepatobiliary: Hepatic cysts, including a dominant 7.2 cm cyst in
the left hepatic lobe (series 2/image 15).

Gallbladder is unremarkable. No intrahepatic or extrahepatic ductal
dilatation.

Pancreas: Within normal limits.

Spleen: Within normal limits.

Adrenals/Urinary Tract: Low-density thickening of the left adrenal
gland. Right adrenal gland is within normal limits.

Kidneys are within normal limits. No renal calculi or
hydronephrosis.

Bladder is within normal limits.

Stomach/Bowel: Stomach is notable for a small hiatal hernia.

No evidence of bowel obstruction.

Appendix is not discretely visualized.

Mild to moderate left colonic stool burden, suggesting mild
constipation.

Vascular/Lymphatic: No evidence of abdominal aortic aneurysm.

Atherosclerotic calcifications of the abdominal aorta and branch
vessels.

No suspicious abdominopelvic lymphadenopathy.

Reproductive: Calcified uterine fibroids.

Bilateral ovaries are within normal limits.

Other: No abdominopelvic ascites.

No evidence of ventral or inguinal hernia.

Musculoskeletal: Status post PLIF at L4-5. Mild degenerative changes
of the visualized thoracolumbar spine.
IMPRESSION: No evidence of bowel obstruction.  Possible mild constipation.

Small hiatal hernia.  No evidence of ventral or inguinal hernia.

Additional ancillary findings as above.

## 2023-12-03 ENCOUNTER — Telehealth: Payer: Self-pay | Admitting: Neurology

## 2023-12-03 NOTE — Telephone Encounter (Signed)
 Pt daughter left a VM wanting to speak to someone about her mother who now lives in [place] she wants to the know the DX so she knows how to help  her mother

## 2023-12-03 NOTE — Telephone Encounter (Signed)
 Can let her know that the last time she was seen was in 2022 and diagnosis was Alzheimer's disease. Since then, she has been seeing a neurologist since 2023 named Dr. Pablo Boards, they will have more updated information since she has not been seen since 2022. Thanks

## 2023-12-04 ENCOUNTER — Telehealth: Payer: Self-pay | Admitting: Neurology

## 2023-12-04 NOTE — Telephone Encounter (Signed)
 Left a message with the after hour service 12-04-23 returning a call to [facility]

## 2023-12-04 NOTE — Telephone Encounter (Signed)
 See other phone note

## 2023-12-04 NOTE — Telephone Encounter (Signed)
Pt daughter called no answer left a voice mail to call the office back  

## 2023-12-04 NOTE — Telephone Encounter (Signed)
 Pt daughter Chelsea Taylor was called an informed the last time she was seen was in 2022 and diagnosis was Alzheimer's disease. Since then, she has been seeing a neurologist since 2023 named Dr. Pablo Boards, they will have more updated information since she has not been seen since 2022. Pt daughter said thank you this information was for her records
